# Patient Record
Sex: Female | Born: 1935 | Race: Black or African American | Hispanic: No | Smoking: Never smoker
Health system: Southern US, Community
[De-identification: ages and names within clinical notes are randomized; demographics above are authoritative.]

## PROBLEM LIST (undated history)

## (undated) DIAGNOSIS — E119 Type 2 diabetes mellitus without complications: Secondary | ICD-10-CM

## (undated) DIAGNOSIS — I4891 Unspecified atrial fibrillation: Secondary | ICD-10-CM

## (undated) DIAGNOSIS — S32000A Wedge compression fracture of unspecified lumbar vertebra, initial encounter for closed fracture: Secondary | ICD-10-CM

## (undated) DIAGNOSIS — N289 Disorder of kidney and ureter, unspecified: Secondary | ICD-10-CM

## (undated) DIAGNOSIS — I10 Essential (primary) hypertension: Secondary | ICD-10-CM

## (undated) DIAGNOSIS — I2510 Atherosclerotic heart disease of native coronary artery without angina pectoris: Secondary | ICD-10-CM

## (undated) SURGERY — LEFT HEART CATH AND CORONARY ANGIOGRAPHY
Anesthesia: Moderate Sedation | Laterality: Right

---

## 2004-07-19 ENCOUNTER — Other Ambulatory Visit: Payer: Self-pay

## 2005-08-05 ENCOUNTER — Ambulatory Visit: Payer: Self-pay | Admitting: Family Medicine

## 2005-09-05 ENCOUNTER — Ambulatory Visit: Payer: Self-pay | Admitting: Gastroenterology

## 2006-08-07 ENCOUNTER — Ambulatory Visit: Payer: Self-pay | Admitting: Family Medicine

## 2007-09-04 DIAGNOSIS — E785 Hyperlipidemia, unspecified: Secondary | ICD-10-CM | POA: Insufficient documentation

## 2007-09-15 ENCOUNTER — Ambulatory Visit: Payer: Self-pay | Admitting: Family Medicine

## 2007-12-30 ENCOUNTER — Ambulatory Visit: Payer: Self-pay | Admitting: Ophthalmology

## 2007-12-30 ENCOUNTER — Other Ambulatory Visit: Payer: Self-pay

## 2008-01-04 DIAGNOSIS — E669 Obesity, unspecified: Secondary | ICD-10-CM | POA: Insufficient documentation

## 2008-08-25 ENCOUNTER — Ambulatory Visit: Payer: Self-pay | Admitting: Family Medicine

## 2008-09-11 ENCOUNTER — Encounter: Admission: RE | Admit: 2008-09-11 | Discharge: 2008-09-11 | Payer: Self-pay | Admitting: Neurosurgery

## 2008-09-19 ENCOUNTER — Ambulatory Visit: Payer: Self-pay | Admitting: Family Medicine

## 2008-09-20 ENCOUNTER — Ambulatory Visit (HOSPITAL_COMMUNITY): Admission: RE | Admit: 2008-09-20 | Discharge: 2008-09-20 | Payer: Self-pay | Admitting: Neurosurgery

## 2008-10-10 ENCOUNTER — Inpatient Hospital Stay (HOSPITAL_COMMUNITY): Admission: RE | Admit: 2008-10-10 | Discharge: 2008-10-11 | Payer: Self-pay | Admitting: Neurosurgery

## 2009-09-08 ENCOUNTER — Ambulatory Visit: Payer: Self-pay | Admitting: Unknown Physician Specialty

## 2010-06-19 ENCOUNTER — Ambulatory Visit: Payer: Self-pay | Admitting: Family Medicine

## 2010-07-11 ENCOUNTER — Ambulatory Visit: Payer: Self-pay | Admitting: Ophthalmology

## 2010-09-30 ENCOUNTER — Ambulatory Visit: Payer: Self-pay | Admitting: Family Medicine

## 2011-03-26 NOTE — Op Note (Signed)
NAME:  Elizabeth Ford, Elizabeth Ford               ACCOUNT NO.:  [id number]   MEDICAL RECORD NO.:  [id number]          PATIENT TYPE:  INP   LOCATION:  3536                         FACILITY:  [facility]   PHYSICIAN:  Hewitt Shorts, M.D.DATE OF BIRTH:  Nov 14, 1925   DATE OF PROCEDURE:  10/10/2008  DATE OF DISCHARGE:                               OPERATIVE REPORT   PREOPERATIVE DIAGNOSES:  1. Right L2-3 lumbar disk herniation.  2. Lumbar degenerative disk disease.  3. Lumbar spondylosis.  4. Lumbar radiculopathy.   POSTOPERATIVE DIAGNOSIS:  1. Right L2-3 lumbar disk herniation.  2. Lumbar degenerative disk disease.  3. Lumbar spondylosis.  4. Lumbar radiculopathy.   PROCEDURES:  Right L2-3 lumbar laminotomy and microdiskectomy with  microdissection.   SURGEON:  Hewitt Shorts, MD   ASSISTANT:  1. Nelia Shi. Webb Silversmith, RN  2. Danae Orleans. Venetia Maxon, MD   ANESTHESIA:  General endotracheal.   INDICATIONS:  The patient is an 77 year old woman who presented with a  right lumbar radiculopathy.  MRI and postmyelogram CT scan revealed a  right L2-3 disk herniation with a fragment that had migrated caudally  behind the body of L3.  Decision was made to proceed with elective  laminotomy and microdiskectomy.   PROCEDURE:  The patient was brought to the operating room and placed  under general endotracheal anesthesia.  The patient was turned to a  prone position.  Lumbar region was prepped with Betadine soap and  solution and draped in a sterile fashion.  The midline was infiltrated  with local anesthetic with epinephrine and x-ray was taken and the L2-3  level identified.  A midline incision was made, carried down through the  subcutaneous tissue.  Bipolar cautery and electrocautery was used to  maintain hemostasis.  Dissection was carried down through lumbar fascia,  which was incised on the right side of the midline and the paraspinal  muscles were dissected from the spinous process and lamina in a  subperiosteal fashion.  The L2-3 intralaminar space was identified.  An  x-ray was taken to confirm the localization.  Then, the microscope was  draped and brought to the field to provide additional navigation,  illumination, and visualization and the remainder of the decompression  was performed using microdissection and microsurgical technique.   Laminotomy was performed using the X-Max drill and Kerrison punches.  The ligamentum flavum was thickened and this was carefully removed.  We  identified the thecal sac and exiting right L3 nerve root and gently  retracted the thecal sac and nerve root medially.  We found a free  fragment of disk compressing the exiting L3 nerve root.  This was  carefully removed in a piecemeal fashion.  The anulus of the L2-3 disk  was identified and the point of disk herniation was identified.  However, the disk space was not entered.  We did remove all these  fragments with disk material from the epidural space and achieved good  decompression.  The wound was irrigated with bacitracin solution and  checked for hemostasis, which was confirmed and then we proceeded with  closure.  Prior to  closure, we instilled 2 mL of fentanyl into the  epidural space.  Deep fascia was closed with interrupted undyed 1 Vicryl  sutures.  Scarpa fascia was closed with interrupted undyed 1 Vicryl  sutures.  The subcutaneous and subcuticular were closed with interrupted  inverted 2-0 undyed Vicryl sutures and the skin was approximated with  Dermabond.  The procedure was tolerated well.  The estimated blood loss  was 50 mL.  Sponge and needle count were correct.  Following surgery,  the patient was returned back to a supine position, reversed from the  anesthetic, extubated, and transferred to the recovery room for further  care where she was noted to be moving all 4 extremities to command.      Hewitt Shorts, M.D.  Electronically Signed     RWN/MEDQ  D:  10/10/2008   T:  10/11/2008  Job:  [id number]   cc:   Hewitt Shorts, M.D.

## 2011-08-13 LAB — CBC
HCT: 34.5 % — ABNORMAL LOW (ref 36.0–46.0)
Hemoglobin: 11.6 g/dL — ABNORMAL LOW (ref 12.0–15.0)
MCHC: 33.8 g/dL (ref 30.0–36.0)
MCV: 87.5 fL (ref 78.0–100.0)
Platelets: 172 10*3/uL (ref 150–400)
RBC: 3.94 MIL/uL (ref 3.87–5.11)
RDW: 13.1 % (ref 11.5–15.5)
WBC: 7 10*3/uL (ref 4.0–10.5)

## 2011-08-13 LAB — GLUCOSE, CAPILLARY
Glucose-Capillary: 111 mg/dL — ABNORMAL HIGH (ref 70–99)
Glucose-Capillary: 132 mg/dL — ABNORMAL HIGH (ref 70–99)
Glucose-Capillary: 195 mg/dL — ABNORMAL HIGH (ref 70–99)
Glucose-Capillary: 85 mg/dL (ref 70–99)
Glucose-Capillary: 126 — ABNORMAL HIGH
Glucose-Capillary: 149 — ABNORMAL HIGH

## 2011-08-13 LAB — BASIC METABOLIC PANEL
BUN: 33 mg/dL — ABNORMAL HIGH (ref 6–23)
CO2: 26 mEq/L (ref 19–32)
Calcium: 9.6 mg/dL (ref 8.4–10.5)
Chloride: 109 mEq/L (ref 96–112)
Creatinine, Ser: 2.23 mg/dL — ABNORMAL HIGH (ref 0.4–1.2)
GFR calc Af Amer: 26 mL/min — ABNORMAL LOW (ref >60)
GFR calc non Af Amer: 21 mL/min — ABNORMAL LOW (ref >60)
Glucose, Bld: 149 mg/dL — ABNORMAL HIGH (ref 70–99)
Potassium: 3.8 mEq/L (ref 3.5–5.1)
Sodium: 142 mEq/L (ref 135–145)

## 2011-08-16 LAB — GLUCOSE, CAPILLARY: Glucose-Capillary: 173 mg/dL — ABNORMAL HIGH (ref 70–99)

## 2012-12-21 ENCOUNTER — Ambulatory Visit: Payer: Self-pay

## 2013-03-02 DIAGNOSIS — R5383 Other fatigue: Secondary | ICD-10-CM | POA: Insufficient documentation

## 2013-03-24 DIAGNOSIS — M1990 Unspecified osteoarthritis, unspecified site: Secondary | ICD-10-CM | POA: Insufficient documentation

## 2013-03-24 DIAGNOSIS — E790 Hyperuricemia without signs of inflammatory arthritis and tophaceous disease: Secondary | ICD-10-CM | POA: Insufficient documentation

## 2013-08-26 DIAGNOSIS — G3184 Mild cognitive impairment, so stated: Secondary | ICD-10-CM | POA: Insufficient documentation

## 2013-10-06 DIAGNOSIS — R000 Tachycardia, unspecified: Secondary | ICD-10-CM | POA: Insufficient documentation

## 2013-10-06 DIAGNOSIS — R5381 Other malaise: Secondary | ICD-10-CM | POA: Insufficient documentation

## 2013-10-14 DIAGNOSIS — R809 Proteinuria, unspecified: Secondary | ICD-10-CM | POA: Insufficient documentation

## 2013-10-28 DIAGNOSIS — Z7189 Other specified counseling: Secondary | ICD-10-CM | POA: Insufficient documentation

## 2014-01-13 DIAGNOSIS — L84 Corns and callosities: Secondary | ICD-10-CM | POA: Insufficient documentation

## 2014-01-24 DIAGNOSIS — N390 Urinary tract infection, site not specified: Secondary | ICD-10-CM | POA: Insufficient documentation

## 2015-06-26 ENCOUNTER — Encounter: Payer: Self-pay | Admitting: Emergency Medicine

## 2015-06-26 ENCOUNTER — Other Ambulatory Visit: Payer: Self-pay

## 2015-06-26 ENCOUNTER — Ambulatory Visit: Payer: Medicare Other

## 2015-06-26 ENCOUNTER — Ambulatory Visit
Admission: EM | Admit: 2015-06-26 | Discharge: 2015-06-26 | Disposition: A | Discharge status: Home / Self Care | Payer: Medicare Other | Attending: Family Medicine | Admitting: Family Medicine

## 2015-06-26 DIAGNOSIS — I4891 Unspecified atrial fibrillation: Secondary | ICD-10-CM | POA: Diagnosis not present

## 2015-06-26 DIAGNOSIS — I10 Essential (primary) hypertension: Secondary | ICD-10-CM | POA: Diagnosis not present

## 2015-06-26 DIAGNOSIS — M25511 Pain in right shoulder: Secondary | ICD-10-CM | POA: Diagnosis not present

## 2015-06-26 HISTORY — DX: Essential (primary) hypertension: I10

## 2015-06-26 NOTE — ED Notes (Signed)
Pt discharge instructions discussed by provider Laurey Morale PA

## 2015-06-26 NOTE — ED Notes (Signed)
Pt states that today at 12pm she was going down the stairs and she states she got really dizzy and fell down the stairs

## 2015-06-26 NOTE — ED Provider Notes (Signed)
CSN: [id number]     Arrival date & time 06/26/15  1601 History   First MD Initiated Contact with Patient 06/26/15 1705     Chief Complaint  Patient presents with  . Fall   (Consider location/radiation/quality/duration/timing/severity/associated sxs/prior Treatment) HPI 81 yo F walking in front of house today, became intensely dizzy and fell to the ground striking her right arm and causing discomfort right shoulder. Mild abrasion point of right elbow. Traveled here with granddaughter-denies any head contact-no tenderness of wrists,  knees or ankles.  Has had a dizzy spell on occasion in the past. None recently but remembers once almost falling out of the bed with one. Has dislocated her right shoulder before She has known hypertension. Her sister has Afib Bradly Bienenstock.  She takes metroprolol and ASA 325 mg and a "kidney drug" she doesn't know name of. Sees Dr Lennox Grumbles at [facility]  Past Medical History  Diagnosis Date  . Hypertension    History reviewed. No pertinent past surgical history. Family History  Problem Relation Age of Onset  . Atrial fibrillation Sister    Social History  Substance Use Topics  . Smoking status: Never Smoker   . Smokeless tobacco: None  . Alcohol Use: No   OB History    No data available     Review of Systems Constitutional: No fever.  Eyes: No visual changes. ENT:No sore throat. Cardiovascular:Negative for chest pain/palpitations Respiratory: Negative for shortness of breath Gastrointestinal: No abdominal pain. No nausea,vomiting, diarrhea Genitourinary: Negative for dysuria. Normal urination. Musculoskeletal: Negative for back pain. FROM extremities without pain- except right shoulder is uncomfortable Skin: Negative for rash Neurological: Negative for headache, focal weakness or numbness  Allergies  Review of patient's allergies indicates no known allergies.  Home Medications   Prior to Admission medications   Not on File   BP 168/87 mmHg   Pulse 63  Temp(Src) 98.2 F (36.8 C) (Oral)  Resp 16  SpO2 100% Physical Exam   Constitutional -alert and oriented,well appearing and in mild distress shoulder, relaxed and interactive  Head-atraumatic, normocephalic Eyes- conjunctiva normal, EOMI ,conjugate gaze Nose- no congestion or rhinorrhea Mouth/throat- mucous membranes moist , Neck- supple CV- irregularly irregular rate , grossly normal heart sounds,  Resp-no distress, normal respiratory effort,clear to auscultation bilaterally- no SOB,  GI- no complaints GU-deferred MSK- non tender, normal ROM, most extremities, ambulatory with cane suuport, self-care-lives alone with husband who is ill Right UE with good pulses, warm , good grip - wrist and elbow negative- right shoulder without ecchymosis, abrasion, Cannot abduct above70 degress; crossover slightly limited ; Left shoulder full crossover and can abduct to 90.  Good grips bilaterally, no neurosensory deficit noted,  Neuro- normal speech and language, no gross focal neurological deficit appreciated, no gait instability, Skin-warm,dry ,intact; no rash noted Psych-mood and affect grossly normal; speech and behavior grossly normal ED Course  Procedures (including critical care time) Labs Review Labs Reviewed - No data to display  Imaging Review Dg Shoulder Right  06/26/2015   CLINICAL DATA:  Patient fell today on cement on her right shoulder. She is having right shoulder pain. She has dislocated her right shoulder a few years ago  EXAM: RIGHT SHOULDER - 2+ VIEW  COMPARISON:  None.  FINDINGS: No acute fracture.  No dislocation.  Mild to moderate AC joint osteoarthritis. There are calcifications along the superior margin of the [facility] joint which are likely synovial/ capsular.  Glenohumeral joint is normally spaced and aligned. There are minimal marginal osteophytes  from the base of the humeral head. There is a rim of calcification along the margin of the glenoid consistent with  calcification along the labrum.  There are other calcifications that project superior to the humeral head and below the acromion along the expected course of the rotator cuff tendons. There is a small subacromial spur.  Bones are demineralized.  Soft tissues are otherwise unremarkable.  IMPRESSION: 1. No fracture or dislocation.  No acute finding. 2. There calcifications involving the capsule/synovium of the [facility] joint, glenoid labrum and rotator cuff. There also mild to moderate AC joint osteoarthritic changes and a subacromial spur.   Electronically Signed   By: Lajean Manes M.D.   On: 06/26/2015 17:09   EKG with A fib   75 rate non-specific ST  T wave changes MDM   Discussed Xray - negative for fracture or dislocation but positive for arthritis. Has Ephraim Hamburger and encouraged to use it and ice packs, sling prn Discussed EKG and now she remembers being in the hospital about a year ago for an episode of symptomatic Afib- She is clinically stable on presentation this evening but needs re-evaluation with long term care provider in AM. Granddaughter included in conversation and agrees. Encourage them to put a meds list in their phones and in her wallet   1. Right shoulder pain   2. Atrial fibrillation, unspecified    Plan: 1. Test results and diagnosis reviewed with patient and grandaughter 2. Maintain usual  Medications- takes 325 mg ASA daily 3. Recommend supportive treatment with right arm sling  applied, ice pak 4. Proceed to ER of choice if any symptoms reviewed develop during the night.  Go to [facility] in the morning to see Dr Lennox Grumbles. Take EKG copy.    Jan Fireman, PA-C 06/26/15 1859

## 2015-06-26 NOTE — Discharge Instructions (Signed)
Please see Dr Lennox Grumbles tomorrow -take your EKG with you ! Take all medications that you take with you for her to see.    IF YOU HAVE CHEST PAIN, DIZZINESS, FALLING, SHORT OF BREATH OR OTHER SYMPTOMS OF CONCERN PLEASE GO TO  THE EMERGENCY ROOM   Atrial Fibrillation Atrial fibrillation is a type of irregular heart rhythm (arrhythmia). During atrial fibrillation, the upper chambers of the heart (atria) quiver continuously in a chaotic pattern. This causes an irregular and often rapid heart rate.  Atrial fibrillation is the result of the heart becoming overloaded with disorganized signals that tell it to beat. These signals are normally released one at a time by a part of the right atrium called the sinoatrial node. They then travel from the atria to the lower chambers of the heart (ventricles), causing the atria and ventricles to contract and pump blood as they pass. In atrial fibrillation, parts of the atria outside of the sinoatrial node also release these signals. This results in two problems. First, the atria receive so many signals that they do not have time to fully contract. Second, the ventricles, which can only receive one signal at a time, beat irregularly and out of rhythm with the atria.  There are three types of atrial fibrillation:   Paroxysmal. Paroxysmal atrial fibrillation starts suddenly and stops on its own within a week.  Persistent. Persistent atrial fibrillation lasts for more than a week. It may stop on its own or with treatment.  Permanent. Permanent atrial fibrillation does not go away. Episodes of atrial fibrillation may lead to permanent atrial fibrillation. Atrial fibrillation can prevent your heart from pumping blood normally. It increases your risk of stroke and can lead to heart failure.  CAUSES   Heart conditions, including a heart attack, heart failure, coronary artery disease, and heart valve conditions.   Inflammation of the sac that surrounds the heart  (pericarditis).  Blockage of an artery in the lungs (pulmonary embolism).  Pneumonia or other infections.  Chronic lung disease.  Thyroid problems, especially if the thyroid is overactive (hyperthyroidism).  Caffeine, excessive alcohol use, and use of some illegal drugs.   Use of some medicines, including certain decongestants and diet pills.  Heart surgery.   Birth defects.  Sometimes, no cause can be found. When this happens, the atrial fibrillation is called lone atrial fibrillation. The risk of complications from atrial fibrillation increases if you have lone atrial fibrillation and you are age 10 years or older. RISK FACTORS  Heart failure.  Coronary artery disease.  Diabetes mellitus.   High blood pressure (hypertension).   Obesity.   Other arrhythmias.   Increased age. SIGNS AND SYMPTOMS   A feeling that your heart is beating rapidly or irregularly.   A feeling of discomfort or pain in your chest.   Shortness of breath.   Sudden light-headedness or weakness.   Getting tired easily when exercising.   Urinating more often than normal (mainly when atrial fibrillation first begins).  In paroxysmal atrial fibrillation, symptoms may start and suddenly stop. DIAGNOSIS  Your health care provider may be able to detect atrial fibrillation when taking your pulse. Your health care provider may have you take a test called an ambulatory electrocardiogram (ECG). An ECG records your heartbeat patterns over a 24-hour period. You may also have other tests, such as:  Transthoracic echocardiogram (TTE). During echocardiography, sound waves are used to evaluate how blood flows through your heart.  Transesophageal echocardiogram (TEE).  Stress test. There is  more than one type of stress test. If a stress test is needed, ask your health care provider about which type is best for you.  Chest X-ray exam.  Blood tests.  Computed tomography (CT). TREATMENT    Treatment may include:  Treating any underlying conditions. For example, if you have an overactive thyroid, treating the condition may correct atrial fibrillation.  Taking medicine. Medicines may be given to control a rapid heart rate or to prevent blood clots, heart failure, or a stroke.  Having a procedure to correct the rhythm of the heart:  Electrical cardioversion. During electrical cardioversion, a controlled, low-energy shock is delivered to the heart through your skin. If you have chest pain, very low blood pressure, or sudden heart failure, this procedure may need to be done as an emergency.  Catheter ablation. During this procedure, heart tissues that send the signals that cause atrial fibrillation are destroyed.  Surgical ablation. During this surgery, thin lines of heart tissue that carry the abnormal signals are destroyed. This procedure can either be an open-heart surgery or a minimally invasive surgery. With the minimally invasive surgery, small cuts are made to access the heart instead of a large opening.  Pulmonary venous isolation. During this surgery, tissue around the veins that carry blood from the lungs (pulmonary veins) is destroyed. This tissue is thought to carry the abnormal signals. HOME CARE INSTRUCTIONS   Take medicines only as directed by your health care provider. Some medicines can make atrial fibrillation worse or recur.  If blood thinners were prescribed by your health care provider, take them exactly as directed. Too much blood-thinning medicine can cause bleeding. If you take too little, you will not have the needed protection against stroke and other problems.  Perform blood tests at home if directed by your health care provider. Perform blood tests exactly as directed.  Quit smoking if you smoke.  Do not drink alcohol.  Do not drink caffeinated beverages such as coffee, soda, and some teas. You may drink decaffeinated coffee, soda, or tea.    Maintain a healthy weight.Do not use diet pills unless your health care provider approves. They may make heart problems worse.   Follow diet instructions as directed by your health care provider.  Exercise regularly as directed by your health care provider.  Keep all follow-up visits as directed by your health care provider. This is important. PREVENTION  The following substances can cause atrial fibrillation to recur:   Caffeinated beverages.  Alcohol.  Certain medicines, especially those used for breathing problems.  Certain herbs and herbal medicines, such as those containing ephedra or ginseng.  Illegal drugs, such as cocaine and amphetamines. Sometimes medicines are given to prevent atrial fibrillation from recurring. Proper treatment of any underlying condition is also important in helping prevent recurrence.  SEEK MEDICAL CARE IF:  You notice a change in the rate, rhythm, or strength of your heartbeat.  You suddenly begin urinating more frequently.  You tire more easily when exerting yourself or exercising. SEEK IMMEDIATE MEDICAL CARE IF:   You have chest pain, abdominal pain, sweating, or weakness.  You feel nauseous.  You have shortness of breath.  You suddenly have swollen feet and ankles.  You feel dizzy.  Your face or limbs feel numb or weak.  You have a change in your vision or speech. MAKE SURE YOU:   Understand these instructions.  Will watch your condition.  Will get help right away if you are not doing well or get  worse. Document Released: 10/28/2005 Document Revised: 03/14/2014 Document Reviewed: 12/08/2012 [facility] Patient Information 2015 [place], [place]. This information is not intended to replace advice given to you by your health care provider. Make sure you discuss any questions you have with your health care provider.  Arm Sling Use A sling is used to:  Limit how much your arm moves.  Make you more comfortable.  Support your  arm. The sling fits well if:  Your elbow rests in the bottom and corner pocket.  Only your fingers show at the opening. Your wrist should fit inside and be supported by the sling.  The strap goes around your shoulder or neck for support.  Your arm is fairly level with your hand, slightly higher than your elbow. HOME CARE   Adjust the sling to keep the hand inside. Slings tend to slip, making the elbow point up. Tug the elbow back into place.  The fingers should feel warm and be a normal color.  Try to keep the palm of the hand toward the body while wearing the sling.  Take the sling off when going to sleep if this is okay with your doctor.  Use an extra pillow at night to protect the arm. Slide the arm between a pillow and the cover.  Take baths or showers as told by your doctor.  Only take medicine as told by your doctor. GET HELP RIGHT AWAY IF:   The fingers turn cold or start to tingle.  The arm pain gets worse.  The pain is not helped by medicine or by adjusting the sling. MAKE SURE YOU:   Understand these instructions.  Will watch this condition.  Will get help right away if you are not doing well or get worse. Document Released: 04/15/2008 Document Revised: 01/20/2012 Document Reviewed: 04/15/2008 [facility] Patient Information 2015 [place], [place]. This information is not intended to replace advice given to you by your health care provider. Make sure you discuss any questions you have with your health care provider.

## 2015-06-27 DIAGNOSIS — R42 Dizziness and giddiness: Secondary | ICD-10-CM | POA: Insufficient documentation

## 2015-07-28 ENCOUNTER — Observation Stay
Admission: EM | Admit: 2015-07-28 | Discharge: 2015-07-30 | Payer: No Typology Code available for payment source | Attending: Internal Medicine | Admitting: Internal Medicine

## 2015-07-28 ENCOUNTER — Encounter: Payer: Self-pay | Admitting: Emergency Medicine

## 2015-07-28 ENCOUNTER — Other Ambulatory Visit: Payer: Self-pay

## 2015-07-28 ENCOUNTER — Emergency Department: Payer: No Typology Code available for payment source

## 2015-07-28 DIAGNOSIS — R109 Unspecified abdominal pain: Secondary | ICD-10-CM | POA: Diagnosis not present

## 2015-07-28 DIAGNOSIS — M25559 Pain in unspecified hip: Secondary | ICD-10-CM | POA: Insufficient documentation

## 2015-07-28 DIAGNOSIS — S161XXA Strain of muscle, fascia and tendon at neck level, initial encounter: Secondary | ICD-10-CM | POA: Diagnosis not present

## 2015-07-28 DIAGNOSIS — I272 Other secondary pulmonary hypertension: Secondary | ICD-10-CM | POA: Insufficient documentation

## 2015-07-28 DIAGNOSIS — M4856XA Collapsed vertebra, not elsewhere classified, lumbar region, initial encounter for fracture: Secondary | ICD-10-CM | POA: Insufficient documentation

## 2015-07-28 DIAGNOSIS — J90 Pleural effusion, not elsewhere classified: Secondary | ICD-10-CM | POA: Insufficient documentation

## 2015-07-28 DIAGNOSIS — I471 Supraventricular tachycardia: Secondary | ICD-10-CM | POA: Insufficient documentation

## 2015-07-28 DIAGNOSIS — D3502 Benign neoplasm of left adrenal gland: Secondary | ICD-10-CM | POA: Diagnosis not present

## 2015-07-28 DIAGNOSIS — Z7982 Long term (current) use of aspirin: Secondary | ICD-10-CM | POA: Diagnosis not present

## 2015-07-28 DIAGNOSIS — R55 Syncope and collapse: Secondary | ICD-10-CM | POA: Diagnosis not present

## 2015-07-28 DIAGNOSIS — M4802 Spinal stenosis, cervical region: Secondary | ICD-10-CM | POA: Insufficient documentation

## 2015-07-28 DIAGNOSIS — Z79899 Other long term (current) drug therapy: Secondary | ICD-10-CM | POA: Diagnosis not present

## 2015-07-28 DIAGNOSIS — M4806 Spinal stenosis, lumbar region: Secondary | ICD-10-CM | POA: Insufficient documentation

## 2015-07-28 DIAGNOSIS — I10 Essential (primary) hypertension: Secondary | ICD-10-CM | POA: Diagnosis not present

## 2015-07-28 DIAGNOSIS — S301XXA Contusion of abdominal wall, initial encounter: Secondary | ICD-10-CM | POA: Insufficient documentation

## 2015-07-28 DIAGNOSIS — I2510 Atherosclerotic heart disease of native coronary artery without angina pectoris: Secondary | ICD-10-CM | POA: Diagnosis not present

## 2015-07-28 DIAGNOSIS — Z23 Encounter for immunization: Secondary | ICD-10-CM | POA: Diagnosis not present

## 2015-07-28 DIAGNOSIS — I482 Chronic atrial fibrillation: Secondary | ICD-10-CM | POA: Insufficient documentation

## 2015-07-28 DIAGNOSIS — S39012A Strain of muscle, fascia and tendon of lower back, initial encounter: Secondary | ICD-10-CM | POA: Diagnosis not present

## 2015-07-28 DIAGNOSIS — N390 Urinary tract infection, site not specified: Secondary | ICD-10-CM | POA: Insufficient documentation

## 2015-07-28 DIAGNOSIS — N1330 Unspecified hydronephrosis: Secondary | ICD-10-CM | POA: Insufficient documentation

## 2015-07-28 DIAGNOSIS — I6523 Occlusion and stenosis of bilateral carotid arteries: Secondary | ICD-10-CM | POA: Diagnosis not present

## 2015-07-28 DIAGNOSIS — K760 Fatty (change of) liver, not elsewhere classified: Secondary | ICD-10-CM | POA: Diagnosis not present

## 2015-07-28 DIAGNOSIS — S338XXA Sprain of other parts of lumbar spine and pelvis, initial encounter: Secondary | ICD-10-CM | POA: Diagnosis present

## 2015-07-28 DIAGNOSIS — E049 Nontoxic goiter, unspecified: Secondary | ICD-10-CM | POA: Diagnosis not present

## 2015-07-28 DIAGNOSIS — S20219A Contusion of unspecified front wall of thorax, initial encounter: Secondary | ICD-10-CM | POA: Diagnosis not present

## 2015-07-28 DIAGNOSIS — M11262 Other chondrocalcinosis, left knee: Secondary | ICD-10-CM | POA: Diagnosis not present

## 2015-07-28 DIAGNOSIS — T1490XA Injury, unspecified, initial encounter: Secondary | ICD-10-CM

## 2015-07-28 DIAGNOSIS — M545 Low back pain: Secondary | ICD-10-CM | POA: Insufficient documentation

## 2015-07-28 DIAGNOSIS — R748 Abnormal levels of other serum enzymes: Secondary | ICD-10-CM | POA: Diagnosis not present

## 2015-07-28 DIAGNOSIS — I639 Cerebral infarction, unspecified: Secondary | ICD-10-CM

## 2015-07-28 DIAGNOSIS — T1490 Injury, unspecified: Secondary | ICD-10-CM | POA: Diagnosis present

## 2015-07-28 DIAGNOSIS — Z87442 Personal history of urinary calculi: Secondary | ICD-10-CM | POA: Insufficient documentation

## 2015-07-28 DIAGNOSIS — E119 Type 2 diabetes mellitus without complications: Secondary | ICD-10-CM | POA: Insufficient documentation

## 2015-07-28 DIAGNOSIS — K8020 Calculus of gallbladder without cholecystitis without obstruction: Secondary | ICD-10-CM | POA: Insufficient documentation

## 2015-07-28 DIAGNOSIS — Z91013 Allergy to seafood: Secondary | ICD-10-CM | POA: Insufficient documentation

## 2015-07-28 DIAGNOSIS — R079 Chest pain, unspecified: Secondary | ICD-10-CM | POA: Insufficient documentation

## 2015-07-28 DIAGNOSIS — S20211A Contusion of right front wall of thorax, initial encounter: Secondary | ICD-10-CM

## 2015-07-28 HISTORY — DX: Disorder of kidney and ureter, unspecified: N28.9

## 2015-07-28 HISTORY — DX: Type 2 diabetes mellitus without complications: E11.9

## 2015-07-28 HISTORY — DX: Unspecified atrial fibrillation: I48.91

## 2015-07-28 LAB — MAGNESIUM: MAGNESIUM: 1.6 mg/dL — AB (ref 1.7–2.4)

## 2015-07-28 LAB — BASIC METABOLIC PANEL
Anion gap: 9 (ref 5–15)
BUN: 29 mg/dL — AB (ref 6–20)
CALCIUM: 9.2 mg/dL (ref 8.9–10.3)
CO2: 22 mmol/L (ref 22–32)
CREATININE: 1.53 mg/dL — AB (ref 0.44–1.00)
Chloride: 109 mmol/L (ref 101–111)
GFR, EST AFRICAN AMERICAN: 34 mL/min — AB (ref >60)
GFR, EST NON AFRICAN AMERICAN: 30 mL/min — AB (ref >60)
Glucose, Bld: 111 mg/dL — ABNORMAL HIGH (ref 65–99)
Potassium: 3.4 mmol/L — ABNORMAL LOW (ref 3.5–5.1)
SODIUM: 140 mmol/L (ref 135–145)

## 2015-07-28 LAB — CBC
HCT: 35.8 % (ref 35.0–47.0)
HEMOGLOBIN: 12 g/dL (ref 12.0–16.0)
MCH: 29.3 pg (ref 26.0–34.0)
MCHC: 33.6 g/dL (ref 32.0–36.0)
MCV: 87.1 fL (ref 80.0–100.0)
PLATELETS: 155 10*3/uL (ref 150–440)
RBC: 4.11 MIL/uL (ref 3.80–5.20)
RDW: 14.1 % (ref 11.5–14.5)
WBC: 11 10*3/uL (ref 3.6–11.0)

## 2015-07-28 LAB — APTT: aPTT: 30 seconds (ref 24–36)

## 2015-07-28 LAB — TROPONIN I: Troponin I: 0.03 ng/mL (ref <0.031)

## 2015-07-28 LAB — TYPE AND SCREEN
ABO/RH(D): O POS
Antibody Screen: NEGATIVE

## 2015-07-28 LAB — ABO/RH: ABO/RH(D): O POS

## 2015-07-28 LAB — PROTIME-INR
INR: 1.09
PROTHROMBIN TIME: 14.3 s (ref 11.4–15.0)

## 2015-07-28 MED ORDER — INFLUENZA VAC SPLIT QUAD 0.5 ML IM SUSY
0.5000 mL | PREFILLED_SYRINGE | INTRAMUSCULAR | Status: AC
  Administered 2015-07-29: 0.5 mL via INTRAMUSCULAR
  Filled 2015-07-28: qty 0.5

## 2015-07-28 MED ORDER — ONDANSETRON HCL 4 MG PO TABS: 4.0000 mg | ORAL_TABLET | Freq: Four times a day (QID) | ORAL | Status: DC | PRN

## 2015-07-28 MED ORDER — IRBESARTAN 75 MG PO TABS
75.0000 mg | ORAL_TABLET | Freq: Every evening | ORAL | Status: DC
  Filled 2015-07-28: qty 1

## 2015-07-28 MED ORDER — ONDANSETRON HCL 4 MG/2ML IJ SOLN
4.0000 mg | Freq: Four times a day (QID) | INTRAMUSCULAR | Status: DC | PRN
  Administered 2015-07-29 – 2015-07-30 (×2): 4 mg via INTRAVENOUS
  Filled 2015-07-28 (×2): qty 2

## 2015-07-28 MED ORDER — HYDROMORPHONE HCL 1 MG/ML IJ SOLN
INTRAMUSCULAR | Status: AC
  Administered 2015-07-28: 0.5 mg via INTRAVENOUS
  Filled 2015-07-28: qty 1

## 2015-07-28 MED ORDER — ACETAMINOPHEN 650 MG RE SUPP: 650.0000 mg | Freq: Four times a day (QID) | RECTAL | Status: DC | PRN

## 2015-07-28 MED ORDER — SODIUM CHLORIDE 0.9 % IV BOLUS (SEPSIS)
500.0000 mL | Freq: Once | INTRAVENOUS | Status: AC
  Administered 2015-07-28: 500 mL via INTRAVENOUS

## 2015-07-28 MED ORDER — ACETAMINOPHEN 325 MG PO TABS
650.0000 mg | ORAL_TABLET | Freq: Four times a day (QID) | ORAL | Status: DC | PRN
  Administered 2015-07-28 – 2015-07-30 (×3): 650 mg via ORAL
  Filled 2015-07-28 (×3): qty 2

## 2015-07-28 MED ORDER — MORPHINE SULFATE (PF) 2 MG/ML IV SOLN: 1.0000 mg | INTRAVENOUS | Status: DC | PRN

## 2015-07-28 MED ORDER — HYDROMORPHONE HCL 1 MG/ML IJ SOLN
0.5000 mg | Freq: Once | INTRAMUSCULAR | Status: AC
  Administered 2015-07-28: 0.5 mg via INTRAVENOUS

## 2015-07-28 MED ORDER — FENTANYL CITRATE (PF) 100 MCG/2ML IJ SOLN
50.0000 ug | Freq: Once | INTRAMUSCULAR | Status: AC
  Administered 2015-07-28: 50 ug via INTRAVENOUS
  Filled 2015-07-28: qty 2

## 2015-07-28 MED ORDER — ASPIRIN EC 325 MG PO TBEC
325.0000 mg | DELAYED_RELEASE_TABLET | Freq: Every evening | ORAL | Status: DC
  Administered 2015-07-29: 325 mg via ORAL
  Filled 2015-07-28 (×2): qty 1

## 2015-07-28 MED ORDER — SODIUM CHLORIDE 0.9 % IJ SOLN
3.0000 mL | Freq: Two times a day (BID) | INTRAMUSCULAR | Status: DC
Start: 2015-07-28 — End: 2015-07-30
  Administered 2015-07-28 – 2015-07-30 (×3): 3 mL via INTRAVENOUS

## 2015-07-28 MED ORDER — HEPARIN SODIUM (PORCINE) 5000 UNIT/ML IJ SOLN
5000.0000 [IU] | Freq: Three times a day (TID) | INTRAMUSCULAR | Status: DC
  Administered 2015-07-28: 5000 [IU] via SUBCUTANEOUS
  Filled 2015-07-28: qty 1

## 2015-07-28 MED ORDER — METOPROLOL SUCCINATE ER 25 MG PO TB24
25.0000 mg | ORAL_TABLET | Freq: Every day | ORAL | Status: DC
  Administered 2015-07-29: 25 mg via ORAL
  Filled 2015-07-28: qty 1

## 2015-07-28 MED ORDER — IOHEXOL 300 MG/ML  SOLN
100.0000 mL | Freq: Once | INTRAMUSCULAR | Status: AC | PRN
  Administered 2015-07-28: 100 mL via INTRAVENOUS
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
## 2015-07-28 NOTE — ED Provider Notes (Addendum)
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 initial encounter      NEW MEDICATIONS STARTED DURING THIS VISIT:  New Prescriptions   No medications on file     Eula Listen, MD 07/28/15 1924  Eula Listen, MD 07/28/15 1924  Eula Listen, MD 07/28/15 1944
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
## 2015-07-29 ENCOUNTER — Observation Stay
Admit: 2015-07-29 | Discharge: 2015-07-29 | Disposition: A | Discharge status: Home / Self Care | Payer: No Typology Code available for payment source | Attending: Specialist | Admitting: Specialist

## 2015-07-29 ENCOUNTER — Observation Stay: Payer: No Typology Code available for payment source

## 2015-07-29 DIAGNOSIS — R55 Syncope and collapse: Secondary | ICD-10-CM | POA: Diagnosis not present

## 2015-07-29 LAB — URINALYSIS COMPLETE WITH MICROSCOPIC (ARMC ONLY)
Bilirubin Urine: NEGATIVE
Glucose, UA: NEGATIVE mg/dL
Ketones, ur: NEGATIVE mg/dL
Nitrite: NEGATIVE
PROTEIN: 30 mg/dL — AB
SPECIFIC GRAVITY, URINE: 1.035 — AB (ref 1.005–1.030)
pH: 5 (ref 5.0–8.0)

## 2015-07-29 LAB — TROPONIN I
TROPONIN I: 0.04 ng/mL — AB (ref <0.031)
Troponin I: 0.05 ng/mL — ABNORMAL HIGH (ref <0.031)

## 2015-07-29 LAB — HEPARIN LEVEL (UNFRACTIONATED)
HEPARIN UNFRACTIONATED: 0.89 [IU]/mL — AB (ref 0.30–0.70)
Heparin Unfractionated: 0.67 IU/mL (ref 0.30–0.70)

## 2015-07-29 MED ORDER — HEPARIN (PORCINE) IN NACL 100-0.45 UNIT/ML-% IJ SOLN
750.0000 [IU]/h | INTRAMUSCULAR | Status: DC
  Administered 2015-07-29: 900 [IU]/h via INTRAVENOUS
  Administered 2015-07-30: 750 [IU]/h via INTRAVENOUS
  Filled 2015-07-29 (×3): qty 250

## 2015-07-29 MED ORDER — TRAMADOL HCL 50 MG PO TABS
50.0000 mg | ORAL_TABLET | Freq: Four times a day (QID) | ORAL | Status: DC | PRN
  Administered 2015-07-29: 50 mg via ORAL
  Filled 2015-07-29: qty 1

## 2015-07-29 MED ORDER — KETOROLAC TROMETHAMINE 15 MG/ML IJ SOLN: 15.0000 mg | Freq: Four times a day (QID) | INTRAMUSCULAR | Status: DC | PRN

## 2015-07-29 MED ORDER — METOPROLOL TARTRATE 25 MG PO TABS
25.0000 mg | ORAL_TABLET | Freq: Two times a day (BID) | ORAL | Status: DC
  Administered 2015-07-29 – 2015-07-30 (×2): 25 mg via ORAL
  Filled 2015-07-29 (×3): qty 1

## 2015-07-29 MED ORDER — DEXTROSE 5 % IV SOLN
1.0000 g | INTRAVENOUS | Status: DC
  Administered 2015-07-29 – 2015-07-30 (×2): 1 g via INTRAVENOUS
  Filled 2015-07-29 (×3): qty 10

## 2015-07-29 NOTE — Progress Notes (Signed)
Patient alert and oriented x4, no complaints at this time. vss at this time. Patient NSR on telemetry. Will continue to assess. Patient up in chair at this time eating lunch. Wilnette Kales
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
## 2015-07-29 NOTE — Progress Notes (Signed)
Patient was educated on the importance of increasing activity to avoid continued weakness and atrophy.  Patient stated understanding of instructions/information provided.
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
## 2015-07-29 NOTE — Progress Notes (Signed)
*  PRELIMINARY RESULTS* Echocardiogram 2D Echocardiogram has been performed.  Elizabeth Ford 07/29/2015, 11:11 AM
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
## 2015-07-29 NOTE — Progress Notes (Signed)
Patient had 3 beat run of SVT. Dr. Humphrey Rolls notified and metoprolol dosage adjusted. Patient has no complaints at this time, will cont to assess. Wilnette Kales

## 2015-07-29 NOTE — Progress Notes (Signed)
U/A results reported to Dr. Reece Levy with no new order.  Dr. Reece Levy stated he was going to put an order in for blood culture to be done.

## 2015-07-29 NOTE — Progress Notes (Signed)
Patient bp low, nauseous, and clammy. Dr. Benjie Karvonen notified and ordered for BP meds to be held at this time, nausea may be due to pain medication given earlier. No other orders at this time. Zofran given will reassess. Wilnette Kales

## 2015-07-30 ENCOUNTER — Ambulatory Visit (HOSPITAL_COMMUNITY)
Admission: AD | Admit: 2015-07-30 | Discharge: 2015-07-30 | Disposition: A | Discharge status: Home / Self Care | Payer: Medicare Other | Source: Other Acute Inpatient Hospital | Attending: Internal Medicine | Admitting: Internal Medicine

## 2015-07-30 DIAGNOSIS — R55 Syncope and collapse: Secondary | ICD-10-CM | POA: Diagnosis present

## 2015-07-30 DIAGNOSIS — T1490XA Injury, unspecified, initial encounter: Secondary | ICD-10-CM | POA: Insufficient documentation

## 2015-07-30 LAB — CBC
HCT: 33.2 % — ABNORMAL LOW (ref 35.0–47.0)
Hemoglobin: 10.8 g/dL — ABNORMAL LOW (ref 12.0–16.0)
MCH: 28.7 pg (ref 26.0–34.0)
MCHC: 32.6 g/dL (ref 32.0–36.0)
MCV: 88.1 fL (ref 80.0–100.0)
PLATELETS: 136 10*3/uL — AB (ref 150–440)
RBC: 3.77 MIL/uL — ABNORMAL LOW (ref 3.80–5.20)
RDW: 14.2 % (ref 11.5–14.5)
WBC: 8.5 10*3/uL (ref 3.6–11.0)

## 2015-07-30 LAB — TROPONIN I: TROPONIN I: 0.06 ng/mL — AB (ref <0.031)

## 2015-07-30 LAB — BASIC METABOLIC PANEL
Anion gap: 9 (ref 5–15)
BUN: 25 mg/dL — AB (ref 6–20)
CALCIUM: 8.7 mg/dL — AB (ref 8.9–10.3)
CO2: 19 mmol/L — ABNORMAL LOW (ref 22–32)
CREATININE: 1.45 mg/dL — AB (ref 0.44–1.00)
Chloride: 109 mmol/L (ref 101–111)
GFR calc Af Amer: 37 mL/min — ABNORMAL LOW (ref >60)
GFR, EST NON AFRICAN AMERICAN: 32 mL/min — AB (ref >60)
GLUCOSE: 127 mg/dL — AB (ref 65–99)
Potassium: 3.5 mmol/L (ref 3.5–5.1)
Sodium: 137 mmol/L (ref 135–145)

## 2015-07-30 MED ORDER — METOPROLOL TARTRATE 25 MG PO TABS: 25.0000 mg | ORAL_TABLET | Freq: Two times a day (BID) | ORAL | Status: DC

## 2015-07-30 MED ORDER — SIMVASTATIN 10 MG PO TABS: 10.0000 mg | ORAL_TABLET | Freq: Every day | ORAL | Status: DC

## 2015-07-30 MED ORDER — DEXTROSE 5 % IV SOLN: 1.0000 g | INTRAVENOUS | Status: DC

## 2015-07-30 NOTE — Progress Notes (Signed)
Report called to Kathryne Hitch, RN at [facility]

## 2015-07-30 NOTE — Progress Notes (Signed)
Zofran 4mg iv given for complaints of nausea 

## 2015-07-30 NOTE — Progress Notes (Signed)
Noted 13 x 10 hard hematoma on the left lower side of the abdomen. Patient c/o burning and stinging from it  and requested for Tylenol 650 mg . Ice is applied on it and Dr. Jannifer Franklin notified . No new order received, MD stated the RN should keep an eye on it and report if it gets bigger. No acute distress noted. Heparin drip reduced from 9 cc /hr to 7.5 cc/hr. Will continue to monitor.

## 2015-07-30 NOTE — Progress Notes (Signed)
[facility] here to transport pt to [facility]

## 2015-07-30 NOTE — Progress Notes (Signed)
SUBJECTIVE: Patient has abdominal pain at the site of the hematoma in the left lower abdomen but denies any chest pain   Filed Vitals:   07/29/15 1853 07/29/15 1939 07/30/15 0515 07/30/15 0827  BP: 127/77 130/78 119/67 121/75  Pulse: 67 74 70 103  Temp:  98.3 F (36.8 C) 98.4 F (36.9 C) 97.9 F (36.6 C)  TempSrc:  Oral Oral Oral  Resp:  18 20 17   Height:      Weight:      SpO2:  96% 100% 98%    Intake/Output Summary (Last 24 hours) at 07/30/15 0852 Last data filed at 07/30/15 0602  Gross per 24 hour  Intake 379.55 ml  Output    300 ml  Net  79.55 ml    LABS: Basic Metabolic Panel:  Recent Labs  07/28/15 1723 07/29/15 0658  NA 140 137  K 3.4* 3.5  CL 109 109  CO2 22 19*  GLUCOSE 111* 127*  BUN 29* 25*  CREATININE 1.53* 1.45*  CALCIUM 9.2 8.7*  MG 1.6*  --    Liver Function Tests: No results for input(s): AST, ALT, ALKPHOS, BILITOT, PROT, ALBUMIN in the last 72 hours. No results for input(s): LIPASE, AMYLASE in the last 72 hours. CBC:  Recent Labs  07/28/15 1723 07/29/15 0658  WBC 11.0 8.5  HGB 12.0 10.8*  HCT 35.8 33.2*  MCV 87.1 88.1  PLT 155 136*   Cardiac Enzymes:  Recent Labs  07/29/15 0002 07/29/15 0658 07/29/15 1859  TROPONINI 0.05* 0.06*  0.04* <0.03   BNP: Invalid input(s): POCBNP D-Dimer: No results for input(s): DDIMER in the last 72 hours. Hemoglobin A1C: No results for input(s): HGBA1C in the last 72 hours. Fasting Lipid Panel: No results for input(s): CHOL, HDL, LDLCALC, TRIG, CHOLHDL, LDLDIRECT in the last 72 hours. Thyroid Function Tests: No results for input(s): TSH, T4TOTAL, T3FREE, THYROIDAB in the last 72 hours.  Invalid input(s): FREET3 Anemia Panel: No results for input(s): VITAMINB12, FOLATE, FERRITIN, TIBC, IRON, RETICCTPCT in the last 72 hours.   PHYSICAL EXAM General: Well developed, well nourished, in no acute distress HEENT:  Normocephalic and atramatic Neck:  No JVD.  Lungs: Clear bilaterally to  auscultation and percussion. Heart: HRRR . Normal S1 and S2 without gallops or murmurs.  Abdomen: Bowel sounds are positive, abdomen soft and non-tender  Msk:  Back normal, normal gait. Normal strength and tone for age. Extremities: No clubbing, cyanosis or edema.   Neuro: Alert and oriented X 3. Psych:  Good affect, responds appropriately  TELEMETRY: Sinus rhythm with occasional supraventricular tachycardia  ASSESSMENT AND PLAN: Syncopal episode most likely secondary to coronary artery disease with possible high-grade lesion. She had mildly elevated troponin when she came suggestive of presumed coronary artery disease. According to the daughter she is scheduled for having catheterization next Friday at [facility] and wants to have it done over there. Thus will cancel cardiac catheterization here. Patient also has a hematoma on both sides of the lower abdomen but left greater than the right and it appears to be subcutaneous thus should not be on any anticoagulants or Plavix . We will DC the heparin. I feel that with everything going on just better that she be transferred to [facility] instead of being discharged. She may have another episode in between today and Friday such as SVT and syncopal episode. I explained all this to the daughter.  Active Problems:   Syncope    Dionisio David, MD, [facility] 07/30/2015 8:52  AM
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
## 2015-08-01 LAB — URINE CULTURE: Culture: >=100000

## 2015-08-07 ENCOUNTER — Encounter
Admission: RE | Admit: 2015-08-07 | Discharge: 2015-08-07 | Disposition: A | Discharge status: Home / Self Care | Payer: Medicare Other | Source: Ambulatory Visit | Attending: Internal Medicine | Admitting: Internal Medicine

## 2015-08-07 DIAGNOSIS — Z794 Long term (current) use of insulin: Secondary | ICD-10-CM | POA: Diagnosis not present

## 2015-08-07 DIAGNOSIS — E119 Type 2 diabetes mellitus without complications: Secondary | ICD-10-CM | POA: Insufficient documentation

## 2015-08-07 LAB — GLUCOSE, CAPILLARY: GLUCOSE-CAPILLARY: 153 mg/dL — AB (ref 65–99)

## 2015-08-08 DIAGNOSIS — E119 Type 2 diabetes mellitus without complications: Secondary | ICD-10-CM | POA: Diagnosis not present

## 2015-08-08 LAB — GLUCOSE, CAPILLARY
GLUCOSE-CAPILLARY: 105 mg/dL — AB (ref 65–99)
Glucose-Capillary: 124 mg/dL — ABNORMAL HIGH (ref 65–99)
Glucose-Capillary: 135 mg/dL — ABNORMAL HIGH (ref 65–99)

## 2015-08-09 DIAGNOSIS — E119 Type 2 diabetes mellitus without complications: Secondary | ICD-10-CM | POA: Diagnosis not present

## 2015-08-09 LAB — GLUCOSE, CAPILLARY: Glucose-Capillary: 98 mg/dL (ref 65–99)

## 2015-08-10 LAB — GLUCOSE, CAPILLARY
Glucose-Capillary: 114 mg/dL — ABNORMAL HIGH (ref 65–99)
Glucose-Capillary: 120 mg/dL — ABNORMAL HIGH (ref 65–99)

## 2015-08-11 DIAGNOSIS — E119 Type 2 diabetes mellitus without complications: Secondary | ICD-10-CM | POA: Diagnosis not present

## 2015-08-11 LAB — GLUCOSE, CAPILLARY
GLUCOSE-CAPILLARY: 127 mg/dL — AB (ref 65–99)
GLUCOSE-CAPILLARY: 119 mg/dL — AB (ref 65–99)
GLUCOSE-CAPILLARY: 82 mg/dL (ref 65–99)
GLUCOSE-CAPILLARY: 114 mg/dL — AB (ref 65–99)
Glucose-Capillary: 109 mg/dL — ABNORMAL HIGH (ref 65–99)
Glucose-Capillary: 133 mg/dL — ABNORMAL HIGH (ref 65–99)
Glucose-Capillary: 109 mg/dL — ABNORMAL HIGH (ref 65–99)

## 2015-08-12 ENCOUNTER — Encounter
Admission: RE | Admit: 2015-08-12 | Discharge: 2015-08-12 | Disposition: A | Discharge status: Home / Self Care | Payer: Medicare Other | Source: Ambulatory Visit | Attending: Internal Medicine | Admitting: Internal Medicine

## 2015-08-12 DIAGNOSIS — Z794 Long term (current) use of insulin: Secondary | ICD-10-CM | POA: Insufficient documentation

## 2015-08-12 DIAGNOSIS — E119 Type 2 diabetes mellitus without complications: Secondary | ICD-10-CM | POA: Insufficient documentation

## 2015-08-12 LAB — GLUCOSE, CAPILLARY: Glucose-Capillary: 98 mg/dL (ref 65–99)

## 2015-08-13 DIAGNOSIS — E119 Type 2 diabetes mellitus without complications: Secondary | ICD-10-CM | POA: Diagnosis not present

## 2015-08-13 LAB — GLUCOSE, CAPILLARY: GLUCOSE-CAPILLARY: 109 mg/dL — AB (ref 65–99)

## 2015-08-14 DIAGNOSIS — E119 Type 2 diabetes mellitus without complications: Secondary | ICD-10-CM | POA: Diagnosis not present

## 2015-08-15 DIAGNOSIS — E119 Type 2 diabetes mellitus without complications: Secondary | ICD-10-CM | POA: Diagnosis not present

## 2015-08-15 LAB — GLUCOSE, CAPILLARY: GLUCOSE-CAPILLARY: 103 mg/dL — AB (ref 65–99)

## 2015-08-16 LAB — GLUCOSE, CAPILLARY: Glucose-Capillary: 100 mg/dL — ABNORMAL HIGH (ref 65–99)

## 2015-10-10 DIAGNOSIS — M25561 Pain in right knee: Secondary | ICD-10-CM | POA: Insufficient documentation

## 2015-11-16 ENCOUNTER — Encounter: Payer: Self-pay | Admitting: Anesthesiology

## 2015-11-16 ENCOUNTER — Other Ambulatory Visit: Payer: Self-pay | Admitting: Neurological Surgery

## 2015-11-16 DIAGNOSIS — M5416 Radiculopathy, lumbar region: Secondary | ICD-10-CM

## 2015-11-17 ENCOUNTER — Inpatient Hospital Stay (HOSPITAL_COMMUNITY)
Admission: EM | Admit: 2015-11-17 | Discharge: 2015-11-19 | DRG: 552 | Disposition: A | Discharge status: Home Health | Payer: Medicare Other | Attending: Internal Medicine | Admitting: Internal Medicine

## 2015-11-17 ENCOUNTER — Ambulatory Visit: Payer: Medicare Other

## 2015-11-17 ENCOUNTER — Inpatient Hospital Stay (HOSPITAL_COMMUNITY): Payer: Medicare Other

## 2015-11-17 ENCOUNTER — Emergency Department (HOSPITAL_COMMUNITY): Payer: Medicare Other

## 2015-11-17 ENCOUNTER — Encounter (HOSPITAL_COMMUNITY): Payer: Self-pay | Admitting: *Deleted

## 2015-11-17 ENCOUNTER — Encounter (HOSPITAL_COMMUNITY): Payer: Self-pay | Admitting: Anesthesiology

## 2015-11-17 DIAGNOSIS — I4891 Unspecified atrial fibrillation: Secondary | ICD-10-CM | POA: Diagnosis present

## 2015-11-17 DIAGNOSIS — Z91013 Allergy to seafood: Secondary | ICD-10-CM

## 2015-11-17 DIAGNOSIS — R32 Unspecified urinary incontinence: Secondary | ICD-10-CM | POA: Diagnosis present

## 2015-11-17 DIAGNOSIS — M5440 Lumbago with sciatica, unspecified side: Principal | ICD-10-CM | POA: Diagnosis present

## 2015-11-17 DIAGNOSIS — M549 Dorsalgia, unspecified: Secondary | ICD-10-CM | POA: Diagnosis present

## 2015-11-17 DIAGNOSIS — M5416 Radiculopathy, lumbar region: Secondary | ICD-10-CM

## 2015-11-17 DIAGNOSIS — I129 Hypertensive chronic kidney disease with stage 1 through stage 4 chronic kidney disease, or unspecified chronic kidney disease: Secondary | ICD-10-CM | POA: Diagnosis present

## 2015-11-17 DIAGNOSIS — I10 Essential (primary) hypertension: Secondary | ICD-10-CM | POA: Diagnosis not present

## 2015-11-17 DIAGNOSIS — E119 Type 2 diabetes mellitus without complications: Secondary | ICD-10-CM | POA: Diagnosis present

## 2015-11-17 DIAGNOSIS — Z7982 Long term (current) use of aspirin: Secondary | ICD-10-CM | POA: Diagnosis not present

## 2015-11-17 DIAGNOSIS — N289 Disorder of kidney and ureter, unspecified: Secondary | ICD-10-CM

## 2015-11-17 DIAGNOSIS — I2510 Atherosclerotic heart disease of native coronary artery without angina pectoris: Secondary | ICD-10-CM | POA: Insufficient documentation

## 2015-11-17 DIAGNOSIS — I480 Paroxysmal atrial fibrillation: Secondary | ICD-10-CM

## 2015-11-17 DIAGNOSIS — E876 Hypokalemia: Secondary | ICD-10-CM | POA: Diagnosis present

## 2015-11-17 DIAGNOSIS — I482 Chronic atrial fibrillation: Secondary | ICD-10-CM | POA: Diagnosis not present

## 2015-11-17 DIAGNOSIS — Z79899 Other long term (current) drug therapy: Secondary | ICD-10-CM

## 2015-11-17 DIAGNOSIS — M545 Low back pain: Secondary | ICD-10-CM | POA: Diagnosis present

## 2015-11-17 DIAGNOSIS — N183 Chronic kidney disease, stage 3 (moderate): Secondary | ICD-10-CM | POA: Diagnosis present

## 2015-11-17 DIAGNOSIS — M4726 Other spondylosis with radiculopathy, lumbar region: Secondary | ICD-10-CM | POA: Diagnosis present

## 2015-11-17 DIAGNOSIS — M79604 Pain in right leg: Secondary | ICD-10-CM | POA: Diagnosis present

## 2015-11-17 DIAGNOSIS — M5441 Lumbago with sciatica, right side: Secondary | ICD-10-CM

## 2015-11-17 DIAGNOSIS — M5459 Other low back pain: Secondary | ICD-10-CM | POA: Diagnosis present

## 2015-11-17 HISTORY — DX: Atherosclerotic heart disease of native coronary artery without angina pectoris: I25.10

## 2015-11-17 HISTORY — DX: Wedge compression fracture of unspecified lumbar vertebra, initial encounter for closed fracture: S32.000A

## 2015-11-17 LAB — BASIC METABOLIC PANEL
Anion gap: 13 (ref 5–15)
BUN: 22 mg/dL — ABNORMAL HIGH (ref 6–20)
CHLORIDE: 103 mmol/L (ref 101–111)
CO2: 23 mmol/L (ref 22–32)
Calcium: 9 mg/dL (ref 8.9–10.3)
Creatinine, Ser: 1.61 mg/dL — ABNORMAL HIGH (ref 0.44–1.00)
GFR calc non Af Amer: 28 mL/min — ABNORMAL LOW (ref >60)
GFR, EST AFRICAN AMERICAN: 32 mL/min — AB (ref >60)
Glucose, Bld: 213 mg/dL — ABNORMAL HIGH (ref 65–99)
POTASSIUM: 3.3 mmol/L — AB (ref 3.5–5.1)
SODIUM: 139 mmol/L (ref 135–145)

## 2015-11-17 LAB — CBC
HEMATOCRIT: 35.9 % — AB (ref 36.0–46.0)
Hemoglobin: 11.4 g/dL — ABNORMAL LOW (ref 12.0–15.0)
MCH: 27.3 pg (ref 26.0–34.0)
MCHC: 31.8 g/dL (ref 30.0–36.0)
MCV: 86.1 fL (ref 78.0–100.0)
PLATELETS: 239 10*3/uL (ref 150–400)
RBC: 4.17 MIL/uL (ref 3.87–5.11)
RDW: 14.1 % (ref 11.5–15.5)
WBC: 7.5 10*3/uL (ref 4.0–10.5)

## 2015-11-17 LAB — GLUCOSE, CAPILLARY: Glucose-Capillary: 165 mg/dL — ABNORMAL HIGH (ref 65–99)

## 2015-11-17 LAB — TSH: TSH: 0.703 u[IU]/mL (ref 0.350–4.500)

## 2015-11-17 MED ORDER — HYDROMORPHONE HCL 1 MG/ML IJ SOLN
1.0000 mg | Freq: Once | INTRAMUSCULAR | Status: AC
  Administered 2015-11-17: 1 mg via INTRAMUSCULAR
  Filled 2015-11-17: qty 1

## 2015-11-17 MED ORDER — METHYLPREDNISOLONE ACETATE 80 MG/ML IJ SUSP
INTRAMUSCULAR | Status: AC
  Filled 2015-11-17: qty 1

## 2015-11-17 MED ORDER — METOPROLOL TARTRATE 1 MG/ML IV SOLN
5.0000 mg | INTRAVENOUS | Status: DC | PRN
  Administered 2015-11-18: 5 mg via INTRAVENOUS
  Filled 2015-11-17: qty 5

## 2015-11-17 MED ORDER — INSULIN ASPART 100 UNIT/ML ~~LOC~~ SOLN: 0.0000 [IU] | Freq: Three times a day (TID) | SUBCUTANEOUS | Status: DC

## 2015-11-17 MED ORDER — ONDANSETRON HCL 4 MG/2ML IJ SOLN: 4.0000 mg | Freq: Three times a day (TID) | INTRAMUSCULAR | Status: DC | PRN

## 2015-11-17 MED ORDER — INSULIN ASPART 100 UNIT/ML ~~LOC~~ SOLN: 0.0000 [IU] | Freq: Every day | SUBCUTANEOUS | Status: DC

## 2015-11-17 MED ORDER — METOPROLOL TARTRATE 25 MG PO TABS
12.5000 mg | ORAL_TABLET | Freq: Once | ORAL | Status: AC
  Administered 2015-11-17: 12.5 mg via ORAL
  Filled 2015-11-17: qty 1

## 2015-11-17 MED ORDER — SODIUM CHLORIDE 0.9 % IV BOLUS (SEPSIS)
500.0000 mL | Freq: Once | INTRAVENOUS | Status: AC
  Administered 2015-11-17: 500 mL via INTRAVENOUS

## 2015-11-17 MED ORDER — SODIUM CHLORIDE 0.9 % IJ SOLN
INTRAMUSCULAR | Status: AC
  Filled 2015-11-17: qty 10

## 2015-11-17 MED ORDER — LIDOCAINE HCL 1 % IJ SOLN
INTRAMUSCULAR | Status: AC
  Filled 2015-11-17: qty 20

## 2015-11-17 MED ORDER — HYDROMORPHONE HCL 1 MG/ML IJ SOLN
0.5000 mg | INTRAMUSCULAR | Status: DC | PRN
  Administered 2015-11-17: 0.5 mg via INTRAVENOUS
  Filled 2015-11-17: qty 1

## 2015-11-17 MED ORDER — SODIUM CHLORIDE 0.9 % IV SOLN
INTRAVENOUS | Status: DC
  Administered 2015-11-18 (×2): via INTRAVENOUS

## 2015-11-17 MED ORDER — HYDROMORPHONE HCL 1 MG/ML IJ SOLN: 1.0000 mg | INTRAMUSCULAR | Status: DC | PRN

## 2015-11-17 MED ORDER — METHYLPREDNISOLONE ACETATE 40 MG/ML IJ SUSP
INTRAMUSCULAR | Status: AC
  Filled 2015-11-17: qty 1
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
## 2015-11-17 NOTE — ED Notes (Signed)
Attempted report x1. 
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
## 2015-11-17 NOTE — ED Notes (Signed)
Pt returned from MRI on stretcher, is sleepy but able to awaken and converse.  Pt denies any pain at present.

## 2015-11-17 NOTE — ED Notes (Signed)
Per family pt was seen at [facility] ED on Monday for atrial fibrillation and syncope. Today also reports severe back pain radiating to both legs, also states urinary incontinence. afib on monitor at present. Respirations unlabored. Denies chest pain at present. Pt is alert and answering questions appropriately.

## 2015-11-17 NOTE — ED Notes (Signed)
Pt in MRI.

## 2015-11-17 NOTE — ED Notes (Signed)
Patient transported to MRI on stretcher.
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
## 2015-11-17 NOTE — ED Notes (Signed)
Daughter Jamse Arn Day [phone]) is taking pt belongings. Daughter is an employee in the [employer] and sts to call her if we needed her.

## 2015-11-17 NOTE — ED Notes (Signed)
Per family, pt sent here for possible admission due to syncope and afib rvr. ekg done at triage, HR irregular, 100-120 at triage.

## 2015-11-18 DIAGNOSIS — M545 Low back pain: Secondary | ICD-10-CM

## 2015-11-18 DIAGNOSIS — I482 Chronic atrial fibrillation: Secondary | ICD-10-CM

## 2015-11-18 DIAGNOSIS — N289 Disorder of kidney and ureter, unspecified: Secondary | ICD-10-CM

## 2015-11-18 LAB — BASIC METABOLIC PANEL
ANION GAP: 10 (ref 5–15)
BUN: 27 mg/dL — ABNORMAL HIGH (ref 6–20)
CHLORIDE: 108 mmol/L (ref 101–111)
CO2: 22 mmol/L (ref 22–32)
Calcium: 8.7 mg/dL — ABNORMAL LOW (ref 8.9–10.3)
Creatinine, Ser: 1.44 mg/dL — ABNORMAL HIGH (ref 0.44–1.00)
GFR calc non Af Amer: 32 mL/min — ABNORMAL LOW (ref >60)
GFR, EST AFRICAN AMERICAN: 37 mL/min — AB (ref >60)
GLUCOSE: 149 mg/dL — AB (ref 65–99)
Potassium: 3.4 mmol/L — ABNORMAL LOW (ref 3.5–5.1)
Sodium: 140 mmol/L (ref 135–145)

## 2015-11-18 LAB — C DIFFICILE QUICK SCREEN W PCR REFLEX
C DIFFICLE (CDIFF) ANTIGEN: NEGATIVE
C Diff interpretation: NEGATIVE
C Diff toxin: NEGATIVE

## 2015-11-18 LAB — CBC
HCT: 32 % — ABNORMAL LOW (ref 36.0–46.0)
HEMOGLOBIN: 10.3 g/dL — AB (ref 12.0–15.0)
MCH: 27.5 pg (ref 26.0–34.0)
MCHC: 32.2 g/dL (ref 30.0–36.0)
MCV: 85.3 fL (ref 78.0–100.0)
PLATELETS: 198 10*3/uL (ref 150–400)
RBC: 3.75 MIL/uL — ABNORMAL LOW (ref 3.87–5.11)
RDW: 14.2 % (ref 11.5–15.5)
WBC: 5.8 10*3/uL (ref 4.0–10.5)

## 2015-11-18 LAB — GLUCOSE, CAPILLARY
GLUCOSE-CAPILLARY: 120 mg/dL — AB (ref 65–99)
GLUCOSE-CAPILLARY: 116 mg/dL — AB (ref 65–99)
Glucose-Capillary: 117 mg/dL — ABNORMAL HIGH (ref 65–99)
Glucose-Capillary: 119 mg/dL — ABNORMAL HIGH (ref 65–99)

## 2015-11-18 MED ORDER — TRAMADOL HCL 50 MG PO TABS
50.0000 mg | ORAL_TABLET | Freq: Four times a day (QID) | ORAL | Status: DC | PRN
  Administered 2015-11-18: 50 mg via ORAL
  Filled 2015-11-18: qty 1

## 2015-11-18 MED ORDER — GUAIFENESIN ER 600 MG PO TB12
600.0000 mg | ORAL_TABLET | Freq: Two times a day (BID) | ORAL | Status: DC
  Administered 2015-11-18 – 2015-11-19 (×3): 600 mg via ORAL
  Filled 2015-11-18 (×3): qty 1

## 2015-11-18 MED ORDER — LOPERAMIDE HCL 2 MG PO CAPS
2.0000 mg | ORAL_CAPSULE | ORAL | Status: DC | PRN
  Administered 2015-11-18: 2 mg via ORAL
  Filled 2015-11-18: qty 1

## 2015-11-18 MED ORDER — ATORVASTATIN CALCIUM 20 MG PO TABS
20.0000 mg | ORAL_TABLET | Freq: Every day | ORAL | Status: DC
  Administered 2015-11-18: 20 mg via ORAL
  Filled 2015-11-18: qty 1

## 2015-11-18 MED ORDER — ASPIRIN EC 325 MG PO TBEC
325.0000 mg | DELAYED_RELEASE_TABLET | Freq: Every evening | ORAL | Status: DC
  Administered 2015-11-18: 325 mg via ORAL
  Filled 2015-11-18: qty 1

## 2015-11-18 MED ORDER — METOPROLOL SUCCINATE ER 25 MG PO TB24
25.0000 mg | ORAL_TABLET | Freq: Every day | ORAL | Status: DC
  Administered 2015-11-18 – 2015-11-19 (×2): 25 mg via ORAL
  Filled 2015-11-18 (×2): qty 1

## 2015-11-18 MED ORDER — POTASSIUM CHLORIDE CRYS ER 20 MEQ PO TBCR
40.0000 meq | EXTENDED_RELEASE_TABLET | Freq: Once | ORAL | Status: AC
  Administered 2015-11-18: 40 meq via ORAL
  Filled 2015-11-18: qty 2

## 2015-11-18 MED ORDER — DIAZEPAM 2 MG PO TABS: 2.0000 mg | ORAL_TABLET | Freq: Four times a day (QID) | ORAL | Status: DC | PRN
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
## 2015-11-18 NOTE — Progress Notes (Signed)
Patient ambulated to bathroom with one assist. Complaining of slight pain, paged physician. Daughter at bedside, call light within reach.
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
## 2015-11-19 DIAGNOSIS — I4891 Unspecified atrial fibrillation: Secondary | ICD-10-CM | POA: Insufficient documentation

## 2015-11-19 LAB — GLUCOSE, CAPILLARY
GLUCOSE-CAPILLARY: 95 mg/dL (ref 65–99)
Glucose-Capillary: 115 mg/dL — ABNORMAL HIGH (ref 65–99)

## 2015-11-19 MED ORDER — IRBESARTAN 75 MG PO TABS: 75.0000 mg | ORAL_TABLET | Freq: Every day | ORAL | Status: DC

## 2015-11-19 NOTE — Progress Notes (Signed)
Reviewed discharge instructions with patient and family. IV removed. Awaiting discharge via a wheelchair.  Roselina Burgueno, Mervin Kung RN

## 2015-11-19 NOTE — Care Management Note (Addendum)
Case Management Note  Patient Details  Name: Elizabeth Ford MRN: [id number] Date of Birth: 1926-10-20  Subjective/Objective:   82 y.o. F who is accompanied by daughter Elizabeth Ford [phone]. Daughter reports they have used [facility] in past and would like them again. All info faxed to [facility] office with confirmation.                 Action/Plan: Anticipate discharge home today. No further CM needs but will be available should additional discharge needs arise.   Expected Discharge Date:                  Expected Discharge Plan:  [place]  In-House Referral:     Discharge planning Services  CM Consult  Post Acute Care Choice:    Choice offered to:  Patient, Adult Children Elizabeth Ford [phone])  DME Arranged:    DME Agency:     HH Arranged:  PT [place]:  [place]  Status of Service:  Completed, signed off  Medicare Important Message Given:    Date Medicare IM Given:    Medicare IM give by:    Date Additional Medicare IM Given:    Additional Medicare Important Message give by:     If discussed at [place] of Stay Meetings, dates discussed:    Additional Comments:  Delrae Sawyers, RN 11/19/2015, 12:40 PM
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
## 2015-11-20 LAB — HEMOGLOBIN A1C
HEMOGLOBIN A1C: 7.1 % — AB (ref 4.8–5.6)
Mean Plasma Glucose: 157 mg/dL

## 2015-11-21 NOTE — Progress Notes (Signed)
Utilization review completed- completed post discharge 

## 2015-12-04 DIAGNOSIS — D735 Infarction of spleen: Secondary | ICD-10-CM | POA: Insufficient documentation

## 2015-12-08 ENCOUNTER — Encounter
Admission: RE | Admit: 2015-12-08 | Discharge: 2015-12-08 | Disposition: A | Discharge status: Home / Self Care | Payer: Medicare Other | Source: Ambulatory Visit | Attending: Internal Medicine | Admitting: Internal Medicine

## 2015-12-08 DIAGNOSIS — E119 Type 2 diabetes mellitus without complications: Secondary | ICD-10-CM | POA: Insufficient documentation

## 2015-12-11 DIAGNOSIS — E119 Type 2 diabetes mellitus without complications: Secondary | ICD-10-CM | POA: Diagnosis present

## 2015-12-11 LAB — GLUCOSE, CAPILLARY
GLUCOSE-CAPILLARY: 154 mg/dL — AB (ref 65–99)
Glucose-Capillary: 112 mg/dL — ABNORMAL HIGH (ref 65–99)
Glucose-Capillary: 145 mg/dL — ABNORMAL HIGH (ref 65–99)

## 2015-12-12 DIAGNOSIS — I7103 Dissection of thoracoabdominal aorta: Secondary | ICD-10-CM | POA: Insufficient documentation

## 2015-12-12 DIAGNOSIS — E119 Type 2 diabetes mellitus without complications: Secondary | ICD-10-CM | POA: Diagnosis not present

## 2015-12-12 LAB — GLUCOSE, CAPILLARY
GLUCOSE-CAPILLARY: 136 mg/dL — AB (ref 65–99)
Glucose-Capillary: 126 mg/dL — ABNORMAL HIGH (ref 65–99)

## 2015-12-13 ENCOUNTER — Encounter
Admission: RE | Admit: 2015-12-13 | Discharge: 2015-12-13 | Disposition: A | Discharge status: Home / Self Care | Payer: Medicare Other | Source: Ambulatory Visit | Attending: Internal Medicine | Admitting: Internal Medicine

## 2015-12-13 DIAGNOSIS — E119 Type 2 diabetes mellitus without complications: Secondary | ICD-10-CM | POA: Insufficient documentation

## 2015-12-15 DIAGNOSIS — E119 Type 2 diabetes mellitus without complications: Secondary | ICD-10-CM | POA: Diagnosis not present

## 2015-12-15 LAB — GLUCOSE, CAPILLARY
GLUCOSE-CAPILLARY: 146 mg/dL — AB (ref 65–99)
GLUCOSE-CAPILLARY: 300 mg/dL — AB (ref 65–99)
GLUCOSE-CAPILLARY: 129 mg/dL — AB (ref 65–99)
GLUCOSE-CAPILLARY: 174 mg/dL — AB (ref 65–99)
Glucose-Capillary: 118 mg/dL — ABNORMAL HIGH (ref 65–99)
Glucose-Capillary: 137 mg/dL — ABNORMAL HIGH (ref 65–99)

## 2015-12-16 DIAGNOSIS — E119 Type 2 diabetes mellitus without complications: Secondary | ICD-10-CM | POA: Diagnosis not present

## 2015-12-16 LAB — GLUCOSE, CAPILLARY
GLUCOSE-CAPILLARY: 121 mg/dL — AB (ref 65–99)
GLUCOSE-CAPILLARY: 133 mg/dL — AB (ref 65–99)

## 2015-12-17 DIAGNOSIS — E119 Type 2 diabetes mellitus without complications: Secondary | ICD-10-CM | POA: Diagnosis not present

## 2015-12-17 LAB — GLUCOSE, CAPILLARY
GLUCOSE-CAPILLARY: 108 mg/dL — AB (ref 65–99)
Glucose-Capillary: 133 mg/dL — ABNORMAL HIGH (ref 65–99)

## 2015-12-18 DIAGNOSIS — E119 Type 2 diabetes mellitus without complications: Secondary | ICD-10-CM | POA: Diagnosis not present

## 2015-12-18 LAB — GLUCOSE, CAPILLARY
GLUCOSE-CAPILLARY: 100 mg/dL — AB (ref 65–99)
Glucose-Capillary: 116 mg/dL — ABNORMAL HIGH (ref 65–99)

## 2015-12-19 DIAGNOSIS — E119 Type 2 diabetes mellitus without complications: Secondary | ICD-10-CM | POA: Diagnosis not present

## 2015-12-19 LAB — GLUCOSE, CAPILLARY: GLUCOSE-CAPILLARY: 104 mg/dL — AB (ref 65–99)

## 2015-12-20 DIAGNOSIS — I7100 Dissection of unspecified site of aorta: Secondary | ICD-10-CM | POA: Insufficient documentation

## 2015-12-20 LAB — GLUCOSE, CAPILLARY
GLUCOSE-CAPILLARY: 140 mg/dL — AB (ref 65–99)
GLUCOSE-CAPILLARY: 131 mg/dL — AB (ref 65–99)

## 2016-02-14 DIAGNOSIS — R339 Retention of urine, unspecified: Secondary | ICD-10-CM | POA: Insufficient documentation

## 2016-04-16 DIAGNOSIS — N819 Female genital prolapse, unspecified: Secondary | ICD-10-CM | POA: Insufficient documentation

## 2016-07-01 DIAGNOSIS — M791 Myalgia, unspecified site: Secondary | ICD-10-CM | POA: Insufficient documentation

## 2016-12-03 DIAGNOSIS — M25519 Pain in unspecified shoulder: Secondary | ICD-10-CM | POA: Insufficient documentation

## 2018-02-05 ENCOUNTER — Ambulatory Visit: Payer: Medicare Other | Admitting: Podiatry

## 2018-02-05 ENCOUNTER — Encounter: Payer: Self-pay | Admitting: Podiatry

## 2018-02-05 VITALS — BP 133/62 | HR 58

## 2018-02-05 DIAGNOSIS — L84 Corns and callosities: Secondary | ICD-10-CM

## 2018-02-05 DIAGNOSIS — Q828 Other specified congenital malformations of skin: Secondary | ICD-10-CM | POA: Diagnosis not present

## 2018-02-05 NOTE — Progress Notes (Signed)
This patient presents to the office with chief complaint of painful calluses and corns and diabetic feet. She says he is having pain in her feet walking and wearing her shoes..  These calluses and corns  are painful walking and wearing his shoes. She has no history of infection or drainage from his feet.  She presents the office today for treatment of her  and a foot evaluation due to her diabetes.  General Appearance  Alert, conversant and in no acute stress.  Vascular  Dorsalis pedis and posterior tibial  pulses are weakly  palpable  bilaterally.  Capillary return is within normal limits  bilaterally. Temperature is within normal limits  bilaterally.  Neurologic  Senn-Weinstein monofilament wire test within normal limits  bilaterally. Muscle power within normal limits bilaterally.  Nails Thick disfigured discolored nails with subungual debris  from hallux to fifth toes bilaterally. No pain associated with nails. No evidence of bacterial infection or drainage bilaterally.  Orthopedic  No limitations of motion of motion feet .  No crepitus or effusions noted.  Hammer toes 5th digits  B/L. Mild  HAV  B/L.  Hammer toes 2-5  B/L.  Skin  normotropic skin noted.  Porokeratosis sub 5th left sub 2,3 left and sub 4th right.  Heloma durum fifth toe  B/l  No signs of infections or ulcers noted.    Porokeratosis  B/L  Callus fifth toes  B/L  Diabetes with no foot complications  IE  Debride porokeratosis  B/L and HD 5th  B/L.Marland Kitchen  A diabetic foot exam was performed and there is no evidence of any vascular or neurologic pathology.   RTC prn.   Gardiner Barefoot DPM

## 2018-04-13 ENCOUNTER — Ambulatory Visit: Payer: Medicare Other | Admitting: Podiatry

## 2018-04-13 ENCOUNTER — Encounter: Payer: Self-pay | Admitting: Podiatry

## 2018-04-13 DIAGNOSIS — Q828 Other specified congenital malformations of skin: Secondary | ICD-10-CM | POA: Diagnosis not present

## 2018-04-13 DIAGNOSIS — E119 Type 2 diabetes mellitus without complications: Secondary | ICD-10-CM | POA: Diagnosis not present

## 2018-04-13 DIAGNOSIS — D689 Coagulation defect, unspecified: Secondary | ICD-10-CM

## 2018-04-13 DIAGNOSIS — M79675 Pain in left toe(s): Secondary | ICD-10-CM

## 2018-04-13 DIAGNOSIS — B351 Tinea unguium: Secondary | ICD-10-CM

## 2018-04-13 DIAGNOSIS — M79674 Pain in right toe(s): Secondary | ICD-10-CM

## 2018-04-13 DIAGNOSIS — L84 Corns and callosities: Secondary | ICD-10-CM

## 2018-04-13 NOTE — Progress Notes (Signed)
Complaint:  Visit Type: Patient returns to my office for continued preventative foot care services. Complaint: Patient states" my nails have grown long and thick and become painful to walk and wear shoes" Patient has been diagnosed with DM with no foot complications. The patient presents for preventative foot care services. No changes to ROS  Podiatric Exam: Vascular: dorsalis pedis and posterior tibial pulses are palpable bilateral. Capillary return is immediate. Temperature gradient is WNL. Skin turgor WNL  Sensorium: Normal Semmes Weinstein monofilament test. Normal tactile sensation bilaterally. Nail Exam: Pt has thick disfigured discolored nails with subungual debris noted bilateral entire nail hallux through fifth toenails Ulcer Exam: There is no evidence of ulcer or pre-ulcerative changes or infection. Orthopedic Exam: Muscle tone and strength are WNL. No limitations in general ROM. No crepitus or effusions noted. Foot type and digits show no abnormalities. Bony prominences are unremarkable. Skin:  Porokeratosis sub-fourth right, sub-first and fourth left. Pinch callus left. Heloma durum fifth bilateral and a heloma molle, fourth and fifth right foot.   No infection or ulcers.    Diagnosis:  Onychomycosis, , Pain in right toe, pain in left toes,  Porokeratosis  Callus/corn  Treatment & Plan Procedures and Treatment: Consent by patient was obtained for treatment procedures.   Debridement of mycotic and hypertrophic toenails, 1 through 5 bilateral and clearing of subungual debris. No ulceration, no infection noted. Debride callus/corn Return Visit-Office Procedure: Patient instructed to return to the office for a follow up visit 3 months for continued evaluation and treatment.    Gardiner Barefoot DPM

## 2018-07-27 ENCOUNTER — Encounter: Payer: Self-pay | Admitting: Podiatry

## 2018-07-27 ENCOUNTER — Ambulatory Visit: Payer: Medicare Other | Admitting: Podiatry

## 2018-07-27 DIAGNOSIS — M79674 Pain in right toe(s): Secondary | ICD-10-CM

## 2018-07-27 DIAGNOSIS — M79675 Pain in left toe(s): Secondary | ICD-10-CM | POA: Diagnosis not present

## 2018-07-27 DIAGNOSIS — B351 Tinea unguium: Secondary | ICD-10-CM

## 2018-07-27 DIAGNOSIS — E119 Type 2 diabetes mellitus without complications: Secondary | ICD-10-CM | POA: Diagnosis not present

## 2018-07-27 DIAGNOSIS — Q828 Other specified congenital malformations of skin: Secondary | ICD-10-CM | POA: Diagnosis not present

## 2018-07-27 DIAGNOSIS — L84 Corns and callosities: Secondary | ICD-10-CM | POA: Diagnosis not present

## 2018-07-27 NOTE — Progress Notes (Signed)
Complaint:  Visit Type: Patient returns to my office for continued preventative foot care services. Complaint: Patient states" my nails have grown long and thick and become painful to walk and wear shoes" Patient has been diagnosed with DM with no foot complications. The patient presents for preventative foot care services. No changes to ROS  Podiatric Exam: Vascular: dorsalis pedis and posterior tibial pulses are palpable bilateral. Capillary return is immediate. Temperature gradient is WNL. Skin turgor WNL  Sensorium: Normal Semmes Weinstein monofilament test. Normal tactile sensation bilaterally. Nail Exam: Pt has thick disfigured discolored nails with subungual debris noted bilateral entire nail hallux through fifth toenails Ulcer Exam: There is no evidence of ulcer or pre-ulcerative changes or infection. Orthopedic Exam: Muscle tone and strength are WNL. No limitations in general ROM. No crepitus or effusions noted. Foot type and digits show no abnormalities. Bony prominences are unremarkable. Skin:  Porokeratosis sub-fourth right, sub-first and fourth left. Pinch callus left. Heloma durum fifth bilateral and a heloma molle, fourth and fifth right foot.   No infection or ulcers.    Diagnosis:  Onychomycosis, , Pain in right toe, pain in left toes,  Porokeratosis  Callus/corn  Treatment & Plan Procedures and Treatment: Consent by patient was obtained for treatment procedures.   Debridement of mycotic and hypertrophic toenails, 1 through 5 bilateral and clearing of subungual debris. No ulceration, no infection noted. Debride callus/corn Return Visit-Office Procedure: Patient instructed to return to the office for a follow up visit 10 weeks  for continued evaluation and treatment.    Demia Viera DPM 

## 2018-10-05 ENCOUNTER — Ambulatory Visit: Payer: Medicare Other | Admitting: Podiatry

## 2018-10-05 ENCOUNTER — Encounter: Payer: Self-pay | Admitting: Podiatry

## 2018-10-05 DIAGNOSIS — M79675 Pain in left toe(s): Secondary | ICD-10-CM | POA: Diagnosis not present

## 2018-10-05 DIAGNOSIS — B351 Tinea unguium: Secondary | ICD-10-CM

## 2018-10-05 DIAGNOSIS — M79674 Pain in right toe(s): Secondary | ICD-10-CM

## 2018-10-05 DIAGNOSIS — E119 Type 2 diabetes mellitus without complications: Secondary | ICD-10-CM

## 2018-10-05 DIAGNOSIS — L84 Corns and callosities: Secondary | ICD-10-CM

## 2018-10-05 DIAGNOSIS — Q828 Other specified congenital malformations of skin: Secondary | ICD-10-CM | POA: Diagnosis not present

## 2018-10-05 DIAGNOSIS — D689 Coagulation defect, unspecified: Secondary | ICD-10-CM

## 2018-10-05 NOTE — Progress Notes (Signed)
Complaint:  Visit Type: Patient returns to my office for continued preventative foot care services. Complaint: Patient states" my nails have grown long and thick and become painful to walk and wear shoes" Patient has been diagnosed with DM with no foot complications. The patient presents for preventative foot care services. No changes to ROS  Podiatric Exam: Vascular: dorsalis pedis and posterior tibial pulses are palpable bilateral. Capillary return is immediate. Temperature gradient is WNL. Skin turgor WNL  Sensorium: Normal Semmes Weinstein monofilament test. Normal tactile sensation bilaterally. Nail Exam: Pt has thick disfigured discolored nails with subungual debris noted bilateral entire nail hallux through fifth toenails Ulcer Exam: There is no evidence of ulcer or pre-ulcerative changes or infection. Orthopedic Exam: Muscle tone and strength are WNL. No limitations in general ROM. No crepitus or effusions noted. Foot type and digits show no abnormalities. Bony prominences are unremarkable. Skin:  Porokeratosis sub-fourth right, sub-first and fourth left. Pinch callus left. Heloma durum fifth bilateral and a heloma molle, fourth and fifth right foot.   No infection or ulcers.    Diagnosis:  Onychomycosis, , Pain in right toe, pain in left toes,  Porokeratosis  Callus/corn  Treatment & Plan Procedures and Treatment: Consent by patient was obtained for treatment procedures.   Debridement of mycotic and hypertrophic toenails, 1 through 5 bilateral and clearing of subungual debris. No ulceration, no infection noted. Debride callus/corn Return Visit-Office Procedure: Patient instructed to return to the office for a follow up visit 10 weeks  for continued evaluation and treatment.    Gardiner Barefoot DPM

## 2018-12-14 ENCOUNTER — Encounter: Payer: Self-pay | Admitting: Podiatry

## 2018-12-14 ENCOUNTER — Ambulatory Visit: Payer: Medicare Other | Admitting: Podiatry

## 2018-12-14 DIAGNOSIS — M79674 Pain in right toe(s): Secondary | ICD-10-CM

## 2018-12-14 DIAGNOSIS — E119 Type 2 diabetes mellitus without complications: Secondary | ICD-10-CM | POA: Diagnosis not present

## 2018-12-14 DIAGNOSIS — M79675 Pain in left toe(s): Secondary | ICD-10-CM

## 2018-12-14 DIAGNOSIS — L84 Corns and callosities: Secondary | ICD-10-CM

## 2018-12-14 DIAGNOSIS — Q828 Other specified congenital malformations of skin: Secondary | ICD-10-CM | POA: Diagnosis not present

## 2018-12-14 DIAGNOSIS — B351 Tinea unguium: Secondary | ICD-10-CM

## 2018-12-14 DIAGNOSIS — D689 Coagulation defect, unspecified: Secondary | ICD-10-CM

## 2018-12-14 NOTE — Progress Notes (Signed)
Complaint:  Visit Type: Patient returns to my office for continued preventative foot care services. Complaint: Patient states" my nails have grown long and thick and become painful to walk and wear shoes" Patient has been diagnosed with DM with no foot complications. The patient presents for preventative foot care services. No changes to ROS  Podiatric Exam: Vascular: dorsalis pedis and posterior tibial pulses are palpable bilateral. Capillary return is immediate. Temperature gradient is WNL. Skin turgor WNL  Sensorium: Normal Semmes Weinstein monofilament test. Normal tactile sensation bilaterally. Nail Exam: Pt has thick disfigured discolored nails with subungual debris noted bilateral entire nail hallux through fifth toenails Ulcer Exam: There is no evidence of ulcer or pre-ulcerative changes or infection. Orthopedic Exam: Muscle tone and strength are WNL. No limitations in general ROM. No crepitus or effusions noted. Foot type and digits show no abnormalities. Bony prominences are unremarkable. Skin:  Porokeratosis sub-fourth right, sub-first and fourth left. Pinch callus left. Heloma durum fifth bilateral and a heloma molle,    No infection or ulcers.    Diagnosis:  Onychomycosis, , Pain in right toe, pain in left toes,  Porokeratosis  Callus/corn  Treatment & Plan Procedures and Treatment: Consent by patient was obtained for treatment procedures.   Debridement of mycotic and hypertrophic toenails, 1 through 5 bilateral and clearing of subungual debris. No ulceration, no infection noted. Debride callus/corn Return Visit-Office Procedure: Patient instructed to return to the office for a follow up visit 10 weeks  for continued evaluation and treatment.    Gardiner Barefoot DPM

## 2019-02-22 ENCOUNTER — Encounter: Payer: Self-pay | Admitting: Podiatry

## 2019-02-22 ENCOUNTER — Other Ambulatory Visit: Payer: Self-pay

## 2019-02-22 ENCOUNTER — Ambulatory Visit: Payer: Medicare Other | Admitting: Podiatry

## 2019-02-22 VITALS — Temp 98.7°F

## 2019-02-22 DIAGNOSIS — L84 Corns and callosities: Secondary | ICD-10-CM

## 2019-02-22 DIAGNOSIS — Q828 Other specified congenital malformations of skin: Secondary | ICD-10-CM

## 2019-02-22 DIAGNOSIS — M79674 Pain in right toe(s): Secondary | ICD-10-CM

## 2019-02-22 DIAGNOSIS — B351 Tinea unguium: Secondary | ICD-10-CM | POA: Diagnosis not present

## 2019-02-22 DIAGNOSIS — E119 Type 2 diabetes mellitus without complications: Secondary | ICD-10-CM | POA: Diagnosis not present

## 2019-02-22 DIAGNOSIS — M79675 Pain in left toe(s): Secondary | ICD-10-CM

## 2019-02-22 DIAGNOSIS — D689 Coagulation defect, unspecified: Secondary | ICD-10-CM | POA: Diagnosis not present

## 2019-02-22 NOTE — Progress Notes (Signed)
Complaint:  Visit Type: Patient returns to my office for continued preventative foot care services. Complaint: Patient states" my nails have grown long and thick and become painful to walk and wear shoes" Patient has been diagnosed with DM with no foot complications. The patient presents for preventative foot care services. No changes to ROS.  Patient has pain noted in callus especially 4/5 right foot. Patient is taking coumadin.  Podiatric Exam: Vascular: dorsalis pedis and posterior tibial pulses are palpable bilateral. Capillary return is immediate. Temperature gradient is WNL. Skin turgor WNL  Sensorium: Normal Semmes Weinstein monofilament test. Normal tactile sensation bilaterally. Nail Exam: Pt has thick disfigured discolored nails with subungual debris noted bilateral entire nail hallux through fifth toenails Ulcer Exam: There is no evidence of ulcer or pre-ulcerative changes or infection. Orthopedic Exam: Muscle tone and strength are WNL. No limitations in general ROM. No crepitus or effusions noted. Foot type and digits show no abnormalities. Bony prominences are unremarkable. Skin:  Porokeratosis sub-fourth right, sub-first and fourth left. Pinch callus left. Heloma durum fifth bilateral and a heloma molle  4/5 right.,    No infection or ulcers.    Diagnosis:  Onychomycosis, , Pain in right toe, pain in left toes,  Porokeratosis  Callus/corn  Treatment & Plan Procedures and Treatment: Consent by patient was obtained for treatment procedures.   Debridement of mycotic and hypertrophic toenails, 1 through 5 bilateral and clearing of subungual debris. No ulceration, no infection noted. Debride callus/corn.  Injection therapy fifth toe right foot. Return Visit-Office Procedure: Patient instructed to return to the office for a follow up visit 10 weeks  for continued evaluation and treatment.    Gardiner Barefoot DPM

## 2019-04-26 ENCOUNTER — Other Ambulatory Visit: Payer: Self-pay

## 2019-04-26 ENCOUNTER — Ambulatory Visit: Payer: Medicare Other | Admitting: Podiatry

## 2019-04-26 ENCOUNTER — Encounter: Payer: Self-pay | Admitting: Podiatry

## 2019-04-26 DIAGNOSIS — B351 Tinea unguium: Secondary | ICD-10-CM | POA: Diagnosis not present

## 2019-04-26 DIAGNOSIS — M79674 Pain in right toe(s): Secondary | ICD-10-CM

## 2019-04-26 DIAGNOSIS — E119 Type 2 diabetes mellitus without complications: Secondary | ICD-10-CM | POA: Diagnosis not present

## 2019-04-26 DIAGNOSIS — M79675 Pain in left toe(s): Secondary | ICD-10-CM | POA: Diagnosis not present

## 2019-04-26 DIAGNOSIS — Q828 Other specified congenital malformations of skin: Secondary | ICD-10-CM | POA: Insufficient documentation

## 2019-04-26 DIAGNOSIS — L84 Corns and callosities: Secondary | ICD-10-CM | POA: Insufficient documentation

## 2019-04-26 NOTE — Progress Notes (Signed)
Complaint:  Visit Type: Patient returns to my office for continued preventative foot care services. Complaint: Patient states" my nails have grown long and thick and become painful to walk and wear shoes" Patient has been diagnosed with DM with no foot complications. The patient presents for preventative foot care services. No changes to ROS.  Patient has pain noted fifth toes  Both feet.  Painful callus on bottom of both feet. Patient is taking coumadin.  Podiatric Exam: Vascular: dorsalis pedis and posterior tibial pulses are palpable bilateral. Capillary return is immediate. Temperature gradient is WNL. Skin turgor WNL  Sensorium: Normal Semmes Weinstein monofilament test. Normal tactile sensation bilaterally. Nail Exam: Pt has thick disfigured discolored nails with subungual debris noted bilateral entire nail hallux through fifth toenails Ulcer Exam: There is no evidence of ulcer or pre-ulcerative changes or infection. Orthopedic Exam: Muscle tone and strength are WNL. No limitations in general ROM. No crepitus or effusions noted. Foot type and digits show no abnormalities. Bony prominences are unremarkable. Skin:  Porokeratosis sub-fourth right, sub-first and fourth left. Pinch callus left. Heloma durum fifth bilateral,    No infection or ulcers.    Diagnosis:  Onychomycosis, , Pain in right toe, pain in left toes,  Porokeratosis  Callus/corn  Treatment & Plan Procedures and Treatment: Consent by patient was obtained for treatment procedures.   Debridement of mycotic and hypertrophic toenails, 1 through 5 bilateral and clearing of subungual debris. No ulceration, no infection noted. Debride callus/corn.  I Return Visit-Office Procedure: Patient instructed to return to the office for a follow up visit 10 weeks  for continued evaluation and treatment.    Gardiner Barefoot DPM

## 2019-07-05 ENCOUNTER — Encounter: Payer: Self-pay | Admitting: Podiatry

## 2019-07-05 ENCOUNTER — Ambulatory Visit (INDEPENDENT_AMBULATORY_CARE_PROVIDER_SITE_OTHER): Payer: Medicare Other | Admitting: Podiatry

## 2019-07-05 ENCOUNTER — Other Ambulatory Visit: Payer: Self-pay

## 2019-07-05 VITALS — Temp 97.3°F

## 2019-07-05 DIAGNOSIS — E119 Type 2 diabetes mellitus without complications: Secondary | ICD-10-CM | POA: Diagnosis not present

## 2019-07-05 DIAGNOSIS — D689 Coagulation defect, unspecified: Secondary | ICD-10-CM | POA: Diagnosis not present

## 2019-07-05 DIAGNOSIS — B351 Tinea unguium: Secondary | ICD-10-CM | POA: Diagnosis not present

## 2019-07-05 DIAGNOSIS — L84 Corns and callosities: Secondary | ICD-10-CM

## 2019-07-05 DIAGNOSIS — M79675 Pain in left toe(s): Secondary | ICD-10-CM

## 2019-07-05 DIAGNOSIS — M79674 Pain in right toe(s): Secondary | ICD-10-CM

## 2019-07-05 NOTE — Progress Notes (Signed)
Complaint:  Visit Type: Patient returns to my office for continued preventative foot care services. Complaint: Patient states" my nails have grown long and thick and become painful to walk and wear shoes" Patient has been diagnosed with DM with no foot complications. The patient presents for preventative foot care services. No changes to ROS.  Patient has pain noted fifth toes  Both feet.  Painful callus on bottom of both feet. Patient is taking coumadin.  Podiatric Exam: Vascular: dorsalis pedis and posterior tibial pulses are palpable bilateral. Capillary return is immediate. Temperature gradient is WNL. Skin turgor WNL  Sensorium: Normal Semmes Weinstein monofilament test. Normal tactile sensation bilaterally. Nail Exam: Pt has thick disfigured discolored nails with subungual debris noted bilateral entire nail hallux through fifth toenails Ulcer Exam: There is no evidence of ulcer or pre-ulcerative changes or infection. Orthopedic Exam: Muscle tone and strength are WNL. No limitations in general ROM. No crepitus or effusions noted. Foot type and digits show no abnormalities. Bony prominences are unremarkable. Skin:  Porokeratosis sub-fourth right, sub-first and fourth left. Pinch callus left. Heloma durum fifth bilateral,    No infection or ulcers.    Diagnosis:  Onychomycosis, , Pain in right toe, pain in left toes,  Porokeratosis  Callus/corn  Treatment & Plan Procedures and Treatment: Consent by patient was obtained for treatment procedures.   Debridement of mycotic and hypertrophic toenails, 1 through 5 bilateral and clearing of subungual debris. No ulceration, no infection noted. Debride callus/corn.  I Return Visit-Office Procedure: Patient instructed to return to the office for a follow up visit 10 weeks  for continued evaluation and treatment.    Lorilee Cafarella DPM 

## 2019-08-30 ENCOUNTER — Ambulatory Visit (INDEPENDENT_AMBULATORY_CARE_PROVIDER_SITE_OTHER): Payer: Medicare Other | Admitting: Podiatry

## 2019-08-30 ENCOUNTER — Other Ambulatory Visit: Payer: Self-pay

## 2019-08-30 ENCOUNTER — Encounter: Payer: Self-pay | Admitting: Podiatry

## 2019-08-30 DIAGNOSIS — Q828 Other specified congenital malformations of skin: Secondary | ICD-10-CM

## 2019-08-30 DIAGNOSIS — L84 Corns and callosities: Secondary | ICD-10-CM

## 2019-08-30 NOTE — Progress Notes (Signed)
Complaint:  Visit Type: Patient returns to my office for continued preventative foot care services. Complaint: Patient states"  That the callus under her right forefoot is extremely painful.  She also has painful corns both feet.   Patient has been diagnosed with DM with no foot complications. The patient presents for preventative foot care services. No changes to ROS.   Patient is taking coumadin.  Podiatric Exam: Vascular: dorsalis pedis and posterior tibial pulses are palpable bilateral. Capillary return is immediate. Temperature gradient is WNL. Skin turgor WNL  Sensorium: Normal Semmes Weinstein monofilament test. Normal tactile sensation bilaterally. Nail Exam: Pt has thick disfigured discolored nails with subungual debris noted bilateral entire nail hallux through fifth toenails Ulcer Exam: There is no evidence of ulcer or pre-ulcerative changes or infection. Orthopedic Exam: Muscle tone and strength are WNL. No limitations in general ROM. No crepitus or effusions noted. Foot type and digits show no abnormalities. Bony prominences are unremarkable. Skin:  Porokeratosis sub-fourth right, sub-first and fourth left. Pinch callus left. Heloma durum fifth bilateral,    No infection or ulcers.    Diagnosis:   Porokeratosis  Callus/corn  Treatment & Plan Procedures and Treatment: Consent by patient was obtained for treatment procedures.   . Debride callus/corn.  both feet.I Return Visit-Office Procedure: Patient instructed to return to the office for a follow up visit prn for preventative foot care services.Gardiner Barefoot DPM

## 2019-11-11 ENCOUNTER — Other Ambulatory Visit: Payer: Self-pay

## 2019-11-11 ENCOUNTER — Encounter: Payer: Self-pay | Admitting: Podiatry

## 2019-11-11 ENCOUNTER — Ambulatory Visit: Payer: Medicare Other | Admitting: Podiatry

## 2019-11-11 DIAGNOSIS — B351 Tinea unguium: Secondary | ICD-10-CM

## 2019-11-11 DIAGNOSIS — Q828 Other specified congenital malformations of skin: Secondary | ICD-10-CM

## 2019-11-11 DIAGNOSIS — M79675 Pain in left toe(s): Secondary | ICD-10-CM | POA: Diagnosis not present

## 2019-11-11 DIAGNOSIS — D689 Coagulation defect, unspecified: Secondary | ICD-10-CM

## 2019-11-11 DIAGNOSIS — E119 Type 2 diabetes mellitus without complications: Secondary | ICD-10-CM | POA: Diagnosis not present

## 2019-11-11 DIAGNOSIS — M79674 Pain in right toe(s): Secondary | ICD-10-CM

## 2019-11-11 NOTE — Progress Notes (Signed)
Complaint:  Visit Type: Patient returns to my office for continued preventative foot care services. Complaint: Patient states"  That the callus under her right forefoot is extremely painful.  She also has painful corns both feet.   Patient has been diagnosed with DM with no foot complications. The patient presents for preventative foot care services. No changes to ROS.   Patient is taking coumadin.  Podiatric Exam: Vascular: dorsalis pedis and posterior tibial pulses are palpable bilateral. Capillary return is immediate. Temperature gradient is WNL. Skin turgor WNL  Sensorium: Normal Semmes Weinstein monofilament test. Normal tactile sensation bilaterally. Nail Exam: Pt has thick disfigured discolored nails with subungual debris noted bilateral entire nail hallux through fifth toenails Ulcer Exam: There is no evidence of ulcer or pre-ulcerative changes or infection. Orthopedic Exam: Muscle tone and strength are WNL. No limitations in general ROM. No crepitus or effusions noted. Foot type and digits show no abnormalities. Bony prominences are unremarkable. Skin:  Porokeratosis sub-fourth right, sub-first and fourth left. Pinch callus left. Heloma durum fifth bilateral,    No infection or ulcers.    Diagnosis:   Porokeratosis  Callus/corn  Treatment & Plan Procedures and Treatment: Consent by patient was obtained for treatment procedures.   . Debride callus/corn.  both feet.I Return Visit-Office Procedure: Patient instructed to return to the office for a follow up visit prn for preventative foot care services.Gardiner Barefoot DPM

## 2020-01-20 ENCOUNTER — Other Ambulatory Visit: Payer: Self-pay

## 2020-01-20 ENCOUNTER — Ambulatory Visit: Payer: Medicare Other | Admitting: Podiatry

## 2020-01-20 ENCOUNTER — Encounter: Payer: Self-pay | Admitting: Podiatry

## 2020-01-20 VITALS — Temp 98.0°F

## 2020-01-20 DIAGNOSIS — Q828 Other specified congenital malformations of skin: Secondary | ICD-10-CM

## 2020-01-20 DIAGNOSIS — E119 Type 2 diabetes mellitus without complications: Secondary | ICD-10-CM

## 2020-01-20 DIAGNOSIS — D689 Coagulation defect, unspecified: Secondary | ICD-10-CM | POA: Diagnosis not present

## 2020-01-20 DIAGNOSIS — L84 Corns and callosities: Secondary | ICD-10-CM | POA: Diagnosis not present

## 2020-01-20 DIAGNOSIS — B351 Tinea unguium: Secondary | ICD-10-CM

## 2020-01-20 DIAGNOSIS — M79675 Pain in left toe(s): Secondary | ICD-10-CM

## 2020-01-20 DIAGNOSIS — M79674 Pain in right toe(s): Secondary | ICD-10-CM

## 2020-01-20 NOTE — Progress Notes (Signed)
This patient returns to my office for at risk foot care.  This patient requires this care by a professional since this patient will be at risk due to having diabetes.chronic kidney disease,  Patient is taking coumadin. This patient is unable to cut nails themselves since the patient cannot reach their nails.These nails are painful walking and wearing shoes.  This patient presents for at risk foot care today.  General Appearance  Alert, conversant and in no acute stress.  Vascular  Dorsalis pedis and posterior tibial  pulses are palpable  bilaterally.  Capillary return is within normal limits  bilaterally. Temperature is within normal limits  bilaterally.  Neurologic  Senn-Weinstein monofilament wire test within normal limits  bilaterally. Muscle power within normal limits bilaterally.  Nails Thick disfigured discolored nails with subungual debris  from hallux to fifth toes bilaterally. No evidence of bacterial infection or drainage bilaterally.  Orthopedic  No limitations of motion  feet .  No crepitus or effusions noted.  No bony pathology or digital deformities noted.  Skin  normotropic skin noted.  Callus left hallux. Sub 4th right and heloma durum fifth  B/L    No signs of infections or ulcers noted.     Onychomycosis  Pain in right toes  Pain in left toes  Callus  B/L  Consent was obtained for treatment procedures.   Mechanical debridement of nails 1-5  bilaterally performed with a nail nipper.  Filed with dremel without incident. No infection or ulcer.  Debridement of callus with # 15 Blade.     Return office visit  10 weeks         Told patient to return for periodic foot care and evaluation due to potential at risk complications.   Gardiner Barefoot DPM

## 2020-05-01 ENCOUNTER — Encounter: Payer: Self-pay | Admitting: Podiatry

## 2020-05-01 ENCOUNTER — Ambulatory Visit: Payer: Medicare Other | Admitting: Podiatry

## 2020-05-01 ENCOUNTER — Other Ambulatory Visit: Payer: Self-pay

## 2020-05-01 DIAGNOSIS — D689 Coagulation defect, unspecified: Secondary | ICD-10-CM | POA: Diagnosis not present

## 2020-05-01 DIAGNOSIS — E119 Type 2 diabetes mellitus without complications: Secondary | ICD-10-CM

## 2020-05-01 DIAGNOSIS — L84 Corns and callosities: Secondary | ICD-10-CM

## 2020-05-01 DIAGNOSIS — Q828 Other specified congenital malformations of skin: Secondary | ICD-10-CM | POA: Diagnosis not present

## 2020-05-01 DIAGNOSIS — M79675 Pain in left toe(s): Secondary | ICD-10-CM | POA: Diagnosis not present

## 2020-05-01 DIAGNOSIS — B351 Tinea unguium: Secondary | ICD-10-CM | POA: Diagnosis not present

## 2020-05-01 DIAGNOSIS — M79674 Pain in right toe(s): Secondary | ICD-10-CM

## 2020-05-01 NOTE — Progress Notes (Signed)
This patient returns to my office for at risk foot care.  This patient requires this care by a professional since this patient will be at risk due to having diabetes.chronic kidney disease,  Patient is taking coumadin.  This patient is unable to cut nails herself  since the patient cannot reach her nails.These nails are painful walking and wearing shoes.   Patient has multiple calluses which are painful when she walks. This patient presents for at risk foot care today.  General Appearance  Alert, conversant and in no acute stress.  Vascular  Dorsalis pedis and posterior tibial  pulses are palpable  bilaterally.  Capillary return is within normal limits  bilaterally. Temperature is within normal limits  bilaterally.  Neurologic  Senn-Weinstein monofilament wire test within normal limits  bilaterally. Muscle power within normal limits bilaterally.  Nails Thick disfigured discolored nails with subungual debris  from hallux to fifth toes bilaterally. No evidence of bacterial infection or drainage bilaterally.  Orthopedic  No limitations of motion  feet .  No crepitus or effusions noted.  No bony pathology or digital deformities noted.  Skin  normotropic skin noted.  Callus left hallux. Sub 4th right and heloma durum fifth  B/L    No signs of infections or ulcers noted.     Onychomycosis  Pain in right toes  Pain in left toes  Callus  B/L  Consent was obtained for treatment procedures.   Mechanical debridement of nails 1-5  bilaterally performed with a nail nipper.  Filed with dremel without incident. No infection or ulcer.  Debridement of callus with # 15 Blade.     Return office visit  10 weeks         Told patient to return for periodic foot care and evaluation due to potential at risk complications.   Gardiner Barefoot DPM

## 2020-07-10 ENCOUNTER — Other Ambulatory Visit: Payer: Self-pay

## 2020-07-10 ENCOUNTER — Ambulatory Visit: Payer: Medicare Other | Admitting: Podiatry

## 2020-07-10 ENCOUNTER — Encounter: Payer: Self-pay | Admitting: Podiatry

## 2020-07-10 DIAGNOSIS — E119 Type 2 diabetes mellitus without complications: Secondary | ICD-10-CM

## 2020-07-10 DIAGNOSIS — Q828 Other specified congenital malformations of skin: Secondary | ICD-10-CM | POA: Diagnosis not present

## 2020-07-10 DIAGNOSIS — B351 Tinea unguium: Secondary | ICD-10-CM | POA: Diagnosis not present

## 2020-07-10 DIAGNOSIS — L84 Corns and callosities: Secondary | ICD-10-CM

## 2020-07-10 DIAGNOSIS — M79675 Pain in left toe(s): Secondary | ICD-10-CM | POA: Diagnosis not present

## 2020-07-10 DIAGNOSIS — D689 Coagulation defect, unspecified: Secondary | ICD-10-CM

## 2020-07-10 DIAGNOSIS — M79674 Pain in right toe(s): Secondary | ICD-10-CM

## 2020-07-10 NOTE — Progress Notes (Signed)
This patient returns to my office for at risk foot care.  This patient requires this care by a professional since this patient will be at risk due to having diabetes.chronic kidney disease and coagulation defect.  Patient is taking coumadin.,    This patient is unable to cut nails herself  since the patient cannot reach her nails.These nails are painful walking and wearing shoes.   Patient has multiple calluses which are painful when she walks. This patient presents for at risk foot care today.  General Appearance  Alert, conversant and in no acute stress.  Vascular  Dorsalis pedis and posterior tibial  pulses are palpable  bilaterally.  Capillary return is within normal limits  bilaterally. Temperature is within normal limits  bilaterally.  Neurologic  Senn-Weinstein monofilament wire test within normal limits  bilaterally. Muscle power within normal limits bilaterally.  Nails Thick disfigured discolored nails with subungual debris  from hallux to fifth toes bilaterally. No evidence of bacterial infection or drainage bilaterally.  Orthopedic  No limitations of motion  feet .  No crepitus or effusions noted.  No bony pathology or digital deformities noted.  Skin  normotropic skin noted.  Callus left hallux. Sub 4th right and heloma durum fifth  B/L    No signs of infections or ulcers noted.     Onychomycosis  Pain in right toes  Pain in left toes  Callus  B/L  Consent was obtained for treatment procedures.   Mechanical debridement of nails 1-5  bilaterally performed with a nail nipper.  Filed with dremel without incident. No infection or ulcer.  Debridement of callus with # 15 Blade.     Return office visit  10 weeks         Told patient to return for periodic foot care and evaluation due to potential at risk complications.   Gardiner Barefoot DPM

## 2020-09-18 ENCOUNTER — Ambulatory Visit: Payer: Medicare Other | Admitting: Podiatry

## 2020-09-20 DIAGNOSIS — IMO0002 Reserved for concepts with insufficient information to code with codable children: Secondary | ICD-10-CM | POA: Insufficient documentation

## 2020-09-25 ENCOUNTER — Encounter: Payer: Self-pay | Admitting: Podiatry

## 2020-09-25 ENCOUNTER — Other Ambulatory Visit: Payer: Self-pay

## 2020-09-25 ENCOUNTER — Ambulatory Visit: Payer: Medicare Other | Admitting: Podiatry

## 2020-09-25 DIAGNOSIS — Q828 Other specified congenital malformations of skin: Secondary | ICD-10-CM | POA: Diagnosis not present

## 2020-09-25 DIAGNOSIS — D689 Coagulation defect, unspecified: Secondary | ICD-10-CM | POA: Diagnosis not present

## 2020-09-25 DIAGNOSIS — E119 Type 2 diabetes mellitus without complications: Secondary | ICD-10-CM

## 2020-09-25 DIAGNOSIS — B351 Tinea unguium: Secondary | ICD-10-CM

## 2020-09-25 DIAGNOSIS — M79674 Pain in right toe(s): Secondary | ICD-10-CM

## 2020-09-25 DIAGNOSIS — M79675 Pain in left toe(s): Secondary | ICD-10-CM

## 2020-09-25 DIAGNOSIS — L84 Corns and callosities: Secondary | ICD-10-CM

## 2020-09-25 NOTE — Progress Notes (Signed)
This patient returns to my office for at risk foot care.  This patient requires this care by a professional since this patient will be at risk due to having diabetes.chronic kidney disease and coagulation defect.  Patient is taking coumadin.,    This patient is unable to cut nails herself  since the patient cannot reach her nails.These nails are painful walking and wearing shoes.   Patient has multiple calluses which are painful when she walks. This patient presents for at risk foot care today.  General Appearance  Alert, conversant and in no acute stress.  Vascular  Dorsalis pedis and posterior tibial  pulses are weakly  palpable  bilaterally.  Capillary return is within normal limits  bilaterally. Temperature is within normal limits  bilaterally.  Neurologic  Senn-Weinstein monofilament wire test within normal limits  bilaterally. Muscle power within normal limits bilaterally.  Nails Thick disfigured discolored nails with subungual debris  from hallux to fifth toes bilaterally. No evidence of bacterial infection or drainage bilaterally.  Orthopedic  No limitations of motion  feet .  No crepitus or effusions noted.  Plantar flexed metatarsal  B/L  Hammer toes 2-5  B/L.  Skin  normotropic skin noted.  Callus left hallux. Sub 4th right and heloma durum fifth  B/L    No signs of infections or ulcers noted.     Onychomycosis  Pain in right toes  Pain in left toes  Callus  B/L  Consent was obtained for treatment procedures.   Mechanical debridement of nails 1-5  bilaterally performed with a nail nipper.  Filed with dremel without incident. No infection or ulcer.  Debridement of callus with # 15 Blade.     Return office visit  9 weeks         Told patient to return for periodic foot care and evaluation due to potential at risk complications.   Gardiner Barefoot DPM

## 2020-11-27 ENCOUNTER — Ambulatory Visit: Payer: Medicare Other | Admitting: Podiatry

## 2020-11-30 ENCOUNTER — Ambulatory Visit: Payer: Medicare Other | Admitting: Podiatry

## 2020-12-11 ENCOUNTER — Ambulatory Visit: Payer: Medicare Other | Admitting: Podiatry

## 2020-12-14 ENCOUNTER — Encounter: Payer: Self-pay | Admitting: Podiatry

## 2020-12-14 ENCOUNTER — Other Ambulatory Visit: Payer: Self-pay

## 2020-12-14 ENCOUNTER — Ambulatory Visit: Payer: Medicare Other | Admitting: Podiatry

## 2020-12-14 DIAGNOSIS — L84 Corns and callosities: Secondary | ICD-10-CM | POA: Diagnosis not present

## 2020-12-14 DIAGNOSIS — N183 Chronic kidney disease, stage 3 unspecified: Secondary | ICD-10-CM | POA: Diagnosis not present

## 2020-12-14 DIAGNOSIS — M79675 Pain in left toe(s): Secondary | ICD-10-CM

## 2020-12-14 DIAGNOSIS — M79674 Pain in right toe(s): Secondary | ICD-10-CM

## 2020-12-14 DIAGNOSIS — D689 Coagulation defect, unspecified: Secondary | ICD-10-CM | POA: Diagnosis not present

## 2020-12-14 DIAGNOSIS — B351 Tinea unguium: Secondary | ICD-10-CM | POA: Diagnosis not present

## 2020-12-14 DIAGNOSIS — E119 Type 2 diabetes mellitus without complications: Secondary | ICD-10-CM

## 2020-12-14 NOTE — Progress Notes (Signed)
This patient returns to my office for at risk foot care.  This patient requires this care by a professional since this patient will be at risk due to having diabetes.chronic kidney disease and coagulation defect.  Patient is taking coumadin.,    This patient is unable to cut nails herself  since the patient cannot reach her nails.These nails are painful walking and wearing shoes.   Patient has multiple calluses which are painful when she walks. This patient presents for at risk foot care today.  General Appearance  Alert, conversant and in no acute stress.  Vascular  Dorsalis pedis and posterior tibial  pulses are weakly  palpable  bilaterally.  Capillary return is within normal limits  bilaterally. Cold feet. Bilaterally. Absent digital hair  B/L.  Neurologic  Senn-Weinstein monofilament wire test within normal limits  bilaterally. Muscle power within normal limits bilaterally.  Nails Thick disfigured discolored nails with subungual debris  from hallux to fifth toes bilaterally. No evidence of bacterial infection or drainage bilaterally.  Orthopedic  No limitations of motion  feet .  No crepitus or effusions noted.  Plantar flexed metatarsal  B/L  Hammer toes 2-5  B/L.  Skin  normotropic skin noted.  Callus left hallux. Sub 4th right and heloma durum fifth  B/L    No signs of infections or ulcers noted.     Onychomycosis  Pain in right toes  Pain in left toes  Callus  B/L  Consent was obtained for treatment procedures.   Mechanical debridement of nails 1-5  bilaterally performed with a nail nipper.  Filed with dremel without incident. No infection or ulcer.  Debridement of callus with # 15 Blade.     Return office visit  9 weeks         Told patient to return for periodic foot care and evaluation due to potential at risk complications.   Gardiner Barefoot DPM

## 2021-02-15 ENCOUNTER — Other Ambulatory Visit: Payer: Self-pay

## 2021-02-15 ENCOUNTER — Encounter: Payer: Self-pay | Admitting: Podiatry

## 2021-02-15 ENCOUNTER — Ambulatory Visit: Payer: Medicare Other | Admitting: Podiatry

## 2021-02-15 DIAGNOSIS — L84 Corns and callosities: Secondary | ICD-10-CM

## 2021-02-15 DIAGNOSIS — M79675 Pain in left toe(s): Secondary | ICD-10-CM | POA: Diagnosis not present

## 2021-02-15 DIAGNOSIS — D689 Coagulation defect, unspecified: Secondary | ICD-10-CM | POA: Diagnosis not present

## 2021-02-15 DIAGNOSIS — N183 Chronic kidney disease, stage 3 unspecified: Secondary | ICD-10-CM | POA: Diagnosis not present

## 2021-02-15 DIAGNOSIS — B351 Tinea unguium: Secondary | ICD-10-CM

## 2021-02-15 DIAGNOSIS — M79674 Pain in right toe(s): Secondary | ICD-10-CM | POA: Diagnosis not present

## 2021-02-15 DIAGNOSIS — E119 Type 2 diabetes mellitus without complications: Secondary | ICD-10-CM | POA: Diagnosis not present

## 2021-02-15 NOTE — Progress Notes (Signed)
This patient returns to my office for at risk foot care.  This patient requires this care by a professional since this patient will be at risk due to having diabetes.chronic kidney disease and coagulation defect.  Patient is taking coumadin.,    This patient is unable to cut nails herself  since the patient cannot reach her nails.These nails are painful walking and wearing shoes.   Patient has multiple calluses which are painful when she walks. This patient presents for at risk foot care today.  General Appearance  Alert, conversant and in no acute stress.  Vascular  Dorsalis pedis and posterior tibial  pulses are weakly  palpable  bilaterally.  Capillary return is within normal limits  bilaterally. Cold feet. Bilaterally. Absent digital hair  B/L.  Neurologic  Senn-Weinstein monofilament wire test within normal limits  bilaterally. Muscle power within normal limits bilaterally.  Nails Thick disfigured discolored nails with subungual debris  from hallux to fifth toes bilaterally. No evidence of bacterial infection or drainage bilaterally.  Orthopedic  No limitations of motion  feet .  No crepitus or effusions noted.  Plantar flexed metatarsal  B/L  Hammer toes 2-5  B/L.  Skin  normotropic skin noted.  Callus left hallux. Sub 4th right and heloma durum fifth  B/L    No signs of infections or ulcers noted.     Onychomycosis  Pain in right toes  Pain in left toes  Callus  B/L  Consent was obtained for treatment procedures.   Mechanical debridement of nails 1-5  bilaterally performed with a nail nipper.  Filed with dremel without incident. No infection or ulcer.  Debridement of callus with # 15 Blade.     Return office visit  9 weeks         Told patient to return for periodic foot care and evaluation due to potential at risk complications.   Gardiner Barefoot DPM

## 2021-02-17 DIAGNOSIS — K922 Gastrointestinal hemorrhage, unspecified: Secondary | ICD-10-CM | POA: Insufficient documentation

## 2021-02-17 DIAGNOSIS — R791 Abnormal coagulation profile: Secondary | ICD-10-CM | POA: Insufficient documentation

## 2021-02-17 DIAGNOSIS — Z7901 Long term (current) use of anticoagulants: Secondary | ICD-10-CM | POA: Insufficient documentation

## 2021-04-19 ENCOUNTER — Ambulatory Visit: Payer: Medicare Other | Admitting: Podiatry

## 2021-05-03 ENCOUNTER — Other Ambulatory Visit: Payer: Self-pay

## 2021-05-03 ENCOUNTER — Encounter: Payer: Self-pay | Admitting: Podiatry

## 2021-05-03 ENCOUNTER — Ambulatory Visit: Payer: Medicare Other | Admitting: Podiatry

## 2021-05-03 DIAGNOSIS — B351 Tinea unguium: Secondary | ICD-10-CM

## 2021-05-03 DIAGNOSIS — N183 Chronic kidney disease, stage 3 unspecified: Secondary | ICD-10-CM

## 2021-05-03 DIAGNOSIS — M79674 Pain in right toe(s): Secondary | ICD-10-CM

## 2021-05-03 DIAGNOSIS — M79675 Pain in left toe(s): Secondary | ICD-10-CM | POA: Diagnosis not present

## 2021-05-03 DIAGNOSIS — L84 Corns and callosities: Secondary | ICD-10-CM

## 2021-05-03 DIAGNOSIS — E119 Type 2 diabetes mellitus without complications: Secondary | ICD-10-CM

## 2021-05-03 DIAGNOSIS — D689 Coagulation defect, unspecified: Secondary | ICD-10-CM

## 2021-05-03 NOTE — Progress Notes (Signed)
This patient returns to my office for at risk foot care.  This patient requires this care by a professional since this patient will be at risk due to having diabetes.chronic kidney disease and coagulation defect.  Patient is taking coumadin.,    This patient is unable to cut nails herself  since the patient cannot reach her nails.These nails are painful walking and wearing shoes.   Patient has multiple calluses which are painful when she walks. This patient presents for at risk foot care today.  General Appearance  Alert, conversant and in no acute stress.  Vascular  Dorsalis pedis and posterior tibial  pulses are weakly  palpable  bilaterally.  Capillary return is within normal limits  bilaterally. Cold feet. Bilaterally. Absent digital hair  B/L.  Neurologic  Senn-Weinstein monofilament wire test within normal limits  bilaterally. Muscle power within normal limits bilaterally.  Nails Thick disfigured discolored nails with subungual debris  from hallux to fifth toes bilaterally. No evidence of bacterial infection or drainage bilaterally.  Orthopedic  No limitations of motion  feet .  No crepitus or effusions noted.  Plantar flexed metatarsal  B/L  Hammer toes 2-5  B/L.  Skin  normotropic skin noted.  Callus left hallux. Sub 4th right and heloma durum fifth  B/L    No signs of infections or ulcers noted.     Onychomycosis  Pain in right toes  Pain in left toes  Callus  B/L  Consent was obtained for treatment procedures.   Mechanical debridement of nails 1-5  bilaterally performed with a nail nipper.  Filed with dremel without incident. No infection or ulcer.  Debridement of callus with # 15 Blade.     Return office visit  9 weeks         Told patient to return for periodic foot care and evaluation due to potential at risk complications.   Gardiner Barefoot DPM

## 2021-05-22 ENCOUNTER — Other Ambulatory Visit
Admission: RE | Admit: 2021-05-22 | Discharge: 2021-05-22 | Disposition: A | Discharge status: Home / Self Care | Payer: Medicare Other | Attending: Ophthalmology | Admitting: Ophthalmology

## 2021-05-22 DIAGNOSIS — H5711 Ocular pain, right eye: Secondary | ICD-10-CM | POA: Insufficient documentation

## 2021-05-22 LAB — SEDIMENTATION RATE: Sed Rate: 21 mm/hr (ref 0–30)

## 2021-05-22 LAB — C-REACTIVE PROTEIN: CRP: 0.6 mg/dL (ref <1.0)

## 2021-07-12 ENCOUNTER — Other Ambulatory Visit: Payer: Self-pay

## 2021-07-12 ENCOUNTER — Ambulatory Visit: Payer: Medicare Other | Admitting: Podiatry

## 2021-07-12 ENCOUNTER — Encounter: Payer: Self-pay | Admitting: Podiatry

## 2021-07-12 DIAGNOSIS — N183 Chronic kidney disease, stage 3 unspecified: Secondary | ICD-10-CM

## 2021-07-12 DIAGNOSIS — M79674 Pain in right toe(s): Secondary | ICD-10-CM | POA: Diagnosis not present

## 2021-07-12 DIAGNOSIS — D689 Coagulation defect, unspecified: Secondary | ICD-10-CM

## 2021-07-12 DIAGNOSIS — M79675 Pain in left toe(s): Secondary | ICD-10-CM

## 2021-07-12 DIAGNOSIS — L84 Corns and callosities: Secondary | ICD-10-CM

## 2021-07-12 DIAGNOSIS — B351 Tinea unguium: Secondary | ICD-10-CM

## 2021-07-12 NOTE — Progress Notes (Signed)
This patient returns to my office for at risk foot care.  This patient requires this care by a professional since this patient will be at risk due to having diabetes.chronic kidney disease and coagulation defect.  Patient is taking coumadin.,    This patient is unable to cut nails herself  since the patient cannot reach her nails.These nails are painful walking and wearing shoes.   Patient has multiple calluses which are painful when she walks. This patient presents for at risk foot care today.  General Appearance  Alert, conversant and in no acute stress.  Vascular  Dorsalis pedis and posterior tibial  pulses are weakly  palpable  bilaterally.  Capillary return is within normal limits  bilaterally. Cold feet. Bilaterally. Absent digital hair  B/L.  Neurologic  Senn-Weinstein monofilament wire test within normal limits  bilaterally. Muscle power within normal limits bilaterally.  Nails Thick disfigured discolored nails with subungual debris  from hallux to fifth toes bilaterally. No evidence of bacterial infection or drainage bilaterally.  Orthopedic  No limitations of motion  feet .  No crepitus or effusions noted.  Plantar flexed metatarsal  B/L  Hammer toes 2-5  B/L.  Skin  normotropic skin noted.  Callus left hallux. Sub 4th right and heloma durum fifth  B/L    No signs of infections or ulcers noted.     Onychomycosis  Pain in right toes  Pain in left toes  Callus  B/L  Consent was obtained for treatment procedures.   Mechanical debridement of nails 1-5  bilaterally performed with a nail nipper.  Filed with dremel without incident. No infection or ulcer.  Debridement of callus with # 15 Blade.     Return office visit  9 weeks         Told patient to return for periodic foot care and evaluation due to potential at risk complications.   Gardiner Barefoot DPM

## 2021-09-02 ENCOUNTER — Emergency Department
Admission: EM | Admit: 2021-09-02 | Discharge: 2021-09-02 | Disposition: A | Discharge status: Home / Self Care | Payer: Medicare Other | Attending: Emergency Medicine | Admitting: Emergency Medicine

## 2021-09-02 ENCOUNTER — Encounter
Admission: EM | Disposition: A | Discharge status: Home / Self Care | Payer: Self-pay | Source: Home / Self Care | Attending: Emergency Medicine

## 2021-09-02 ENCOUNTER — Other Ambulatory Visit: Payer: Self-pay

## 2021-09-02 ENCOUNTER — Encounter: Payer: Self-pay | Admitting: Emergency Medicine

## 2021-09-02 ENCOUNTER — Emergency Department: Payer: Medicare Other

## 2021-09-02 DIAGNOSIS — I2510 Atherosclerotic heart disease of native coronary artery without angina pectoris: Secondary | ICD-10-CM | POA: Diagnosis not present

## 2021-09-02 DIAGNOSIS — R55 Syncope and collapse: Secondary | ICD-10-CM | POA: Insufficient documentation

## 2021-09-02 DIAGNOSIS — Z20822 Contact with and (suspected) exposure to covid-19: Secondary | ICD-10-CM | POA: Diagnosis not present

## 2021-09-02 DIAGNOSIS — I10 Essential (primary) hypertension: Secondary | ICD-10-CM | POA: Diagnosis not present

## 2021-09-02 DIAGNOSIS — E860 Dehydration: Secondary | ICD-10-CM | POA: Diagnosis not present

## 2021-09-02 DIAGNOSIS — Z794 Long term (current) use of insulin: Secondary | ICD-10-CM | POA: Insufficient documentation

## 2021-09-02 DIAGNOSIS — E119 Type 2 diabetes mellitus without complications: Secondary | ICD-10-CM | POA: Insufficient documentation

## 2021-09-02 LAB — CBC WITH DIFFERENTIAL/PLATELET
Abs Immature Granulocytes: 0.06 10*3/uL (ref 0.00–0.07)
Basophils Absolute: 0.1 10*3/uL (ref 0.0–0.1)
Basophils Relative: 1 %
Eosinophils Absolute: 0 10*3/uL (ref 0.0–0.5)
Eosinophils Relative: 0 %
HCT: 36.8 % (ref 36.0–46.0)
Hemoglobin: 12.2 g/dL (ref 12.0–15.0)
Immature Granulocytes: 1 %
Lymphocytes Relative: 10 %
Lymphs Abs: 1.1 10*3/uL (ref 0.7–4.0)
MCH: 31.8 pg (ref 26.0–34.0)
MCHC: 33.2 g/dL (ref 30.0–36.0)
MCV: 95.8 fL (ref 80.0–100.0)
Monocytes Absolute: 1 10*3/uL (ref 0.1–1.0)
Monocytes Relative: 9 %
Neutro Abs: 8.9 10*3/uL — ABNORMAL HIGH (ref 1.7–7.7)
Neutrophils Relative %: 79 %
Platelets: 190 10*3/uL (ref 150–400)
RBC: 3.84 MIL/uL — ABNORMAL LOW (ref 3.87–5.11)
RDW: 13.9 % (ref 11.5–15.5)
WBC: 11.1 10*3/uL — ABNORMAL HIGH (ref 4.0–10.5)
nRBC: 0 % (ref 0.0–0.2)

## 2021-09-02 LAB — COMPREHENSIVE METABOLIC PANEL
ALT: 13 U/L (ref 0–44)
AST: 18 U/L (ref 15–41)
Albumin: 4.2 g/dL (ref 3.5–5.0)
Alkaline Phosphatase: 64 U/L (ref 38–126)
Anion gap: 16 — ABNORMAL HIGH (ref 5–15)
BUN: 37 mg/dL — ABNORMAL HIGH (ref 8–23)
CO2: 23 mmol/L (ref 22–32)
Calcium: 9.6 mg/dL (ref 8.9–10.3)
Chloride: 102 mmol/L (ref 98–111)
Creatinine, Ser: 1.94 mg/dL — ABNORMAL HIGH (ref 0.44–1.00)
GFR, Estimated: 24 mL/min — ABNORMAL LOW (ref >60)
Glucose, Bld: 163 mg/dL — ABNORMAL HIGH (ref 70–99)
Potassium: 4.3 mmol/L (ref 3.5–5.1)
Sodium: 141 mmol/L (ref 135–145)
Total Bilirubin: 1 mg/dL (ref 0.3–1.2)
Total Protein: 7.9 g/dL (ref 6.5–8.1)

## 2021-09-02 LAB — TROPONIN I (HIGH SENSITIVITY)
Troponin I (High Sensitivity): 14 ng/L (ref <18)
Troponin I (High Sensitivity): 14 ng/L (ref <18)

## 2021-09-02 LAB — RESP PANEL BY RT-PCR (FLU A&B, COVID) ARPGX2
Influenza A by PCR: NEGATIVE
Influenza B by PCR: NEGATIVE
SARS Coronavirus 2 by RT PCR: NEGATIVE

## 2021-09-02 SURGERY — CORONARY/GRAFT ACUTE MI REVASCULARIZATION
Anesthesia: Moderate Sedation

## 2021-09-02 MED ORDER — SODIUM CHLORIDE 0.9 % IV BOLUS
500.0000 mL | Freq: Once | INTRAVENOUS | Status: AC
  Administered 2021-09-02: 500 mL via INTRAVENOUS

## 2021-09-02 NOTE — Significant Event (Signed)
Elizabeth Ford is a 87 year old female with a history of CKD, nonobstructive CAD (cath 2016), persistent atrial fibrillation, recent GI bleed in April 2022 who presents following a presyncopal episode.  Her EKG showed atrial fibrillation with a right bundle branch block, and nondiagnostic ST elevation in the lateral leads.  Code STEMI was called and thus I evaluated the patient.  At the time of my evaluation she appears well and has no chest pain or shortness of breath.  She says she had an episode of dizziness followed by presyncope while at home prompting EMS being called but feels fine now.  Her presentation is not consistent with an ST elevation MI, and thus will not be performing a emergent cardiac catheterization.  I defer further work-up to the emergency department team.  Andrez Grime, MD

## 2021-09-02 NOTE — ED Notes (Signed)
Orthos performed, pt asymptomatic despite VS. Abel to ambulate to bathroom with walker & void sans complications.   Daughter states she will take her home if she dispos & she has a f/u appt with EP MD on Wednesday. MD notified of all.

## 2021-09-02 NOTE — Discharge Instructions (Signed)
Your EKG, chest x-ray, and lab test today were all okay.  Your evaluation is reassuring, and your symptoms appear to be due to dehydration.  Be sure to eat regularly and drink plenty of fluids over the next few days.

## 2021-09-02 NOTE — ED Triage Notes (Signed)
Pt reports that she went to church this am and when she got home she felt dizzy and had near syncope. Pt reports that she did fall but did not hit her head. She denies any chest pain. When Medics were on scene they did an EKG that looked like she was having a STEMI, they called code STEMI. Dr. Corky Sox was at bedside when pt came in and cancelled the Code STEMI.

## 2021-09-02 NOTE — ED Provider Notes (Signed)
[facility] Emergency Department Provider Note  ____________________________________________  Time seen: Approximately 5:28 PM  I have reviewed the triage vital signs and the nursing notes.   HISTORY  Chief Complaint Dizziness    HPI Elizabeth Ford is a 87 y.o. female with a past history of CAD, diabetes hypertension atrial fibrillation who comes ED complaining of an episode of dizziness at home.  Patient states she has been in her usual state of health, woke up this morning and had super cereal and orange juice.  Went to church, after returning home at about 2:00 PM she got up and was walking from 1 room to the next in her home when she felt lightheaded causing her to fall down.  She denies any significant injury, does not think that she passed out.  She remembers the event.  Denies any chest pain or shortness of breath.  No abdominal pain back pain vomiting or diaphoresis.  Symptoms were brief, lasting less than 1 minute and then resolving.  She feels fine right now and still has no pain or shortness of breath.  She reports she has not eaten lunch yet today and so the cereal is the only thing she is eaten so far.    Past Medical History:  Diagnosis Date   Atrial fibrillation ([place])    CAD (coronary artery disease)    Compression fx, lumbar spine ([place])    L4   Diabetes mellitus without complication ([place])    Hypertension    Renal insufficiency      Patient Active Problem List   Diagnosis Date Noted   GIB (gastrointestinal bleeding) 02/17/2021   Supratherapeutic INR 02/17/2021   Warfarin anticoagulation 02/17/2021   Problem 09/20/2020   Pain due to onychomycosis of toenails of both feet 04/26/2019   Heloma durum 04/26/2019   Porokeratosis 04/26/2019   Shoulder pain 12/03/2016   Myalgia 07/01/2016   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   Incomplete emptying of bladder 02/14/2016   Dissection of aorta ([place]) 12/20/2015   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   Atrial fibrillation with RVR ([place])    Intractable low back pain 11/17/2015   Urinary incontinence 11/17/2015   Intractable back pain 11/17/2015   Diabetes mellitus without complication [facility])    Atrial fibrillation ([place])    Hypertension    Renal insufficiency    CAD (coronary artery disease)    Arthralgia of right knee 10/10/2015   Trauma 07/30/2015   Syncope 07/28/2015   Vertigo 06/27/2015   Recurrent urinary tract infection 01/24/2014   Corns and callosity 01/13/2014   Advanced care planning/counseling discussion 10/28/2013   Microalbuminuria 10/14/2013   Malaise and fatigue 10/06/2013   Tachycardia 10/06/2013   Mild cognitive impairment 08/26/2013   Chronic kidney disease, stage III (moderate) ([place]) 03/24/2013   DJD (degenerative joint disease) 03/24/2013   Hyperuricemia 03/24/2013   Fatigue 03/02/2013   Obesity 01/04/2008   Hyperlipidemia 09/04/2007     History reviewed. No pertinent surgical history.   Prior to Admission medications   Medication Sig Start Date End Date Taking? Authorizing Provider  ACCU-CHEK AVIVA PLUS test strip  01/06/18   [provider]  ACCU-CHEK SOFTCLIX LANCETS lancets  08/17/18   [provider]  amiodarone (PACERONE) 200 MG tablet Take 100 mg by mouth daily. 04/26/21   [provider]  amLODipine (NORVASC) 2.5 MG tablet Take 2.5 mg by mouth daily. 02/26/21   [provider]  amLODipine (NORVASC) 5 MG  tablet  12/15/17   [provider]  atorvastatin (LIPITOR) 20 MG tablet Take by mouth. 01/06/20   [provider]  Blood Glucose Monitoring Suppl (ACCU-CHEK GUIDE ME) w/Device KIT  01/19/20   [provider]  Blood Glucose Monitoring Suppl ([place]) [place]  01/19/20   [provider]  [place] suspension  02/16/19   [provider]  docusate sodium (COLACE) 100 MG capsule Take by mouth.     [provider]  ELIQUIS 2.5 MG TABS tablet Take 2.5 mg by mouth 2 (two) times daily. 03/25/21   [provider]  erythromycin ophthalmic ointment APPLY A SMALL AMOUNT INTO RIGHT EYE THREE TIMES DAILY 01/11/20   [provider]  estradiol (ESTRACE) 0.1 MG/GM vaginal cream Place vaginally. 01/27/20   [provider]  estradiol (ESTRING) 2 MG vaginal ring Place vaginally.    [provider]  fluticasone (FLONASE) 50 MCG/ACT nasal spray 1 spray by Each Nare route daily. 04/21/18 04/21/19  [provider]  fosfomycin (MONUROL) 3 g PACK TAKE 3G BY MOUTH ONCE FOR 1 DOSE 02/22/20   [provider]  gabapentin (NEURONTIN) 300 MG capsule Take 300 mg by mouth daily. 05/22/21   [provider]  hypromellose (GENTEAL) 0.3 % GEL ophthalmic ointment Administer 1 drop to both eyes Three (3) times a day as needed.    [provider]  ketoconazole (NIZORAL) 2 % cream  12/28/18   [provider]  Lancets Misc. (ACCU-CHEK SOFTCLIX LANCET DEV) KIT  08/17/18   [provider]  UNABLE TO FIND SMARTSIG:Topical 05/11/20   [provider]     Allergies Nsaids, Hydrochlorothiazide, Lisinopril, Lovastatin, Pravastatin, and Shellfish allergy   Family History  Problem Relation Age of Onset   Atrial fibrillation Sister    Hypertension Mother    Hypertension Father    Diabetes Father     Social History Social History   Tobacco Use   Smoking status: Never   Smokeless tobacco: Never  Substance Use Topics   Alcohol use: No   Drug use: Not Currently    Review of Systems  Constitutional:   No fever or chills.  ENT:   No sore throat. No rhinorrhea. Cardiovascular:   No chest pain, positive near syncope. Respiratory:   No dyspnea or cough. Gastrointestinal:   Negative for abdominal pain, vomiting and diarrhea.  Musculoskeletal:   Negative for focal pain or swelling All other systems reviewed and are negative  except as documented above in ROS and HPI.  ____________________________________________   PHYSICAL EXAM:  VITAL SIGNS: ED Triage Vitals  Enc Vitals Group     BP 09/02/21 1537 140/90     Pulse Rate 09/02/21 1537 97     Resp 09/02/21 1537 (!) 22     Temp 09/02/21 1537 98.7 F (37.1 C)     Temp Source 09/02/21 1537 Oral     SpO2 09/02/21 1537 97 %     Weight 09/02/21 1539 164 lb (74.4 kg)     Height 09/02/21 1539 '5\' 4"'  (1.626 m)     Head Circumference --      Peak Flow --      Pain Score 09/02/21 1539 0     Pain Loc --      Pain Edu? --      Excl. in [place]? --     Vital signs reviewed, nursing assessments reviewed.   Constitutional:   Alert and oriented. Non-toxic appearance. Eyes:  Conjunctivae are normal. EOMI. PERRL. ENT      Head:   Normocephalic and atraumatic.      Nose:   Normal.      Mouth/Throat: Dry mucous membranes.      Neck:   No meningismus. Full ROM. Hematological/Lymphatic/Immunilogical:   No cervical lymphadenopathy. Cardiovascular:   RRR. Symmetric bilateral radial and DP pulses.  No murmurs. Cap refill less than 2 seconds. Respiratory:   Normal respiratory effort without tachypnea/retractions. Breath sounds are clear and equal bilaterally. No wheezes/rales/rhonchi. Gastrointestinal:   Soft and nontender. Non distended. There is no CVA tenderness.  No rebound, rigidity, or guarding. Genitourinary:   deferred Musculoskeletal:   Normal range of motion in all extremities. No joint effusions.  No lower extremity tenderness.  No edema. Neurologic:   Normal speech and language.  Motor grossly intact. No acute focal neurologic deficits are appreciated.  Skin:    Skin is warm, dry and intact. No rash noted.  No petechiae, purpura, or bullae.  ____________________________________________    LABS (pertinent positives/negatives) (all labs ordered are listed, but only abnormal results are displayed) Labs Reviewed  RESP PANEL BY RT-PCR (FLU A&B, COVID) ARPGX2 -  Abnormal; Notable for the following components:      Result Value   SARS Coronavirus 2 by RT PCR   (*)    Value: INVALID, UNABLE TO DETERMINE THE PRESENCE OF TARGET DUE TO SPECIMEN INTEGRITY. RECOLLECTION REQUESTED.   Influenza A by PCR   (*)    Value: INVALID, UNABLE TO DETERMINE THE PRESENCE OF TARGET DUE TO SPECIMEN INTEGRITY. RECOLLECTION REQUESTED.   Influenza B by PCR   (*)    Value: INVALID, UNABLE TO DETERMINE THE PRESENCE OF TARGET DUE TO SPECIMEN INTEGRITY. RECOLLECTION REQUESTED.   All other components within normal limits  COMPREHENSIVE METABOLIC PANEL - Abnormal; Notable for the following components:   Glucose, Bld 163 (*)    BUN 37 (*)    Creatinine, Ser 1.94 (*)    GFR, Estimated 24 (*)    Anion gap 16 (*)    All other components within normal limits  CBC WITH DIFFERENTIAL/PLATELET - Abnormal; Notable for the following components:   WBC 11.1 (*)    RBC 3.84 (*)    Neutro Abs 8.9 (*)    All other components within normal limits  RESP PANEL BY RT-PCR (FLU A&B, COVID) ARPGX2  TROPONIN I (HIGH SENSITIVITY)  TROPONIN I (HIGH SENSITIVITY)   ____________________________________________   EKG  Interpreted by me Atrial fibrillation, rate of 100.  Right axis.  Right bundle branch block.  Normal ST segments.  Isolated T wave inversions in lead III and lead V3, nonspecific.  No ischemic changes.  No acute ischemic changes compared to previous EKG on November 17, 2015.  ____________________________________________    RADIOLOGY  DG Chest Portable 1 View  Result Date: 09/02/2021 CLINICAL DATA:  Cough and fatigue EXAM: PORTABLE CHEST 1 VIEW COMPARISON:  Chest x-ray 11/17/2015, CT chest 07/28/2015 FINDINGS: Slightly prominent cardiac silhouette likely due to AP portable technique. The heart and mediastinal contours are unchanged. No focal consolidation. No pulmonary edema. No pleural effusion. No pneumothorax. No acute osseous abnormality. IMPRESSION: No active disease.  Electronically Signed   By: Iven Finn M.D.   On: 09/02/2021 15:56    ____________________________________________   PROCEDURES Procedures  ____________________________________________  DIFFERENTIAL DIAGNOSIS   Dehydration, electrolyte abnormality, non-STEMI, autonomic insufficiency/orthostatics, UTI, viral illness  CLINICAL IMPRESSION / ASSESSMENT AND PLAN / ED COURSE  Medications ordered in the ED:  Medications  sodium chloride 0.9 % bolus 500 mL (0 mLs Intravenous Stopped 09/02/21 1934)    Pertinent labs & imaging results that were available during my care of the patient were reviewed by me and considered in my medical decision making (see chart for details).  Elizabeth Ford was evaluated in Emergency Department on 09/02/2021 for the symptoms described in the history of present illness. She was evaluated in the context of the global COVID-19 pandemic, which necessitated consideration that the patient might be at risk for infection with the SARS-CoV-2 virus that causes COVID-19. Institutional protocols and algorithms that pertain to the evaluation of patients at risk for COVID-19 are in a state of rapid change based on information released by regulatory bodies including the CDC and federal and state organizations. These policies and algorithms were followed during the patient's care in the ED.     Clinical Course as of 09/02/21 2000  Sun Sep 02, 2021  1536 Seen on arrival by myself and cardiology Dr. Corky Sox.  No shortness of breath or chest pain.  History more worrisome for viral illness, dehydration, pneumonia, or pleural effusion.  Due to age and comorbidities, will obtain labs including serial troponins.  Will give IV fluids for hydration.  Cardiology has canceled the code STEMI.  No evidence of any significant trauma, no headache or head injury. [PS]  9278 Chest x-ray viewed and interpreted by me, appears unremarkable except for some evidence of underlying emphysema. [PS]     Clinical Course User Index [PS] Carrie Mew, MD     ----------------------------------------- 8:00 PM on 09/02/2021 ----------------------------------------- Patient continues to be asymptomatic in the ED.  She is ambulatory with steady gait, no orthostatic symptoms.  She already has an appoint with her cardiologist within the next week.  Awaiting delta troponin after which time patient can be discharged unless abnormal.  ____________________________________________   FINAL CLINICAL IMPRESSION(S) / ED DIAGNOSES    Final diagnoses:  Near syncope  Dehydration     ED Discharge Orders     None       Portions of this note were generated with dragon dictation software. Dictation errors may occur despite best attempts at proofreading.    Carrie Mew, MD 09/02/21 2000

## 2021-09-17 ENCOUNTER — Ambulatory Visit: Payer: Medicare Other | Admitting: Podiatry

## 2021-10-24 DIAGNOSIS — B029 Zoster without complications: Secondary | ICD-10-CM | POA: Insufficient documentation

## 2021-11-01 ENCOUNTER — Ambulatory Visit: Payer: Medicare Other | Admitting: Podiatry

## 2021-11-01 ENCOUNTER — Encounter: Payer: Self-pay | Admitting: Podiatry

## 2021-11-01 ENCOUNTER — Other Ambulatory Visit: Payer: Self-pay

## 2021-11-01 DIAGNOSIS — M79675 Pain in left toe(s): Secondary | ICD-10-CM | POA: Diagnosis not present

## 2021-11-01 DIAGNOSIS — B351 Tinea unguium: Secondary | ICD-10-CM

## 2021-11-01 DIAGNOSIS — N183 Chronic kidney disease, stage 3 unspecified: Secondary | ICD-10-CM

## 2021-11-01 DIAGNOSIS — M79674 Pain in right toe(s): Secondary | ICD-10-CM | POA: Diagnosis not present

## 2021-11-01 DIAGNOSIS — L84 Corns and callosities: Secondary | ICD-10-CM

## 2021-11-01 DIAGNOSIS — D689 Coagulation defect, unspecified: Secondary | ICD-10-CM

## 2021-11-01 DIAGNOSIS — E119 Type 2 diabetes mellitus without complications: Secondary | ICD-10-CM | POA: Diagnosis not present

## 2021-11-01 NOTE — Progress Notes (Signed)
This patient returns to my office for at risk foot care.  This patient requires this care by a professional since this patient will be at risk due to having diabetes.chronic kidney disease and coagulation defect.  Patient is taking coumadin.,    This patient is unable to cut nails herself  since the patient cannot reach her nails.These nails are painful walking and wearing shoes.   Patient has multiple calluses which are painful when she walks. This patient presents for at risk foot care today.  General Appearance  Alert, conversant and in no acute stress.  Vascular  Dorsalis pedis and posterior tibial  pulses are weakly  palpable  bilaterally.  Capillary return is within normal limits  bilaterally. Cold feet. Bilaterally. Absent digital hair  B/L.  Neurologic  Senn-Weinstein monofilament wire test within normal limits  bilaterally. Muscle power within normal limits bilaterally.  Nails Thick disfigured discolored nails with subungual debris  from hallux to fifth toes bilaterally. No evidence of bacterial infection or drainage bilaterally.  Orthopedic  No limitations of motion  feet .  No crepitus or effusions noted.  Plantar flexed metatarsal  B/L  Hammer toes 2-5  B/L.  Skin  normotropic skin noted.  Callus left hallux. Sub 4th right and heloma durum fifth  B/L    No signs of infections or ulcers noted.     Onychomycosis  Pain in right toes  Pain in left toes  Callus  B/L  Consent was obtained for treatment procedures.   Mechanical debridement of nails 1-5  bilaterally performed with a nail nipper.  Filed with dremel without incident. No infection or ulcer.  Debridement of callus with # 15 Blade.     Return office visit  9 weeks         Told patient to return for periodic foot care and evaluation due to potential at risk complications.   Gardiner Barefoot DPM

## 2021-12-21 IMAGING — DX DG CHEST 1V PORT
1 series · 1 of 1 positions shown · non-contrast
Comparison: Chest x-ray 11/17/2015, CT chest 07/28/2015

CLINICAL DATA: Cough and fatigue

EXAM:
PORTABLE CHEST 1 VIEW

[chest ap]
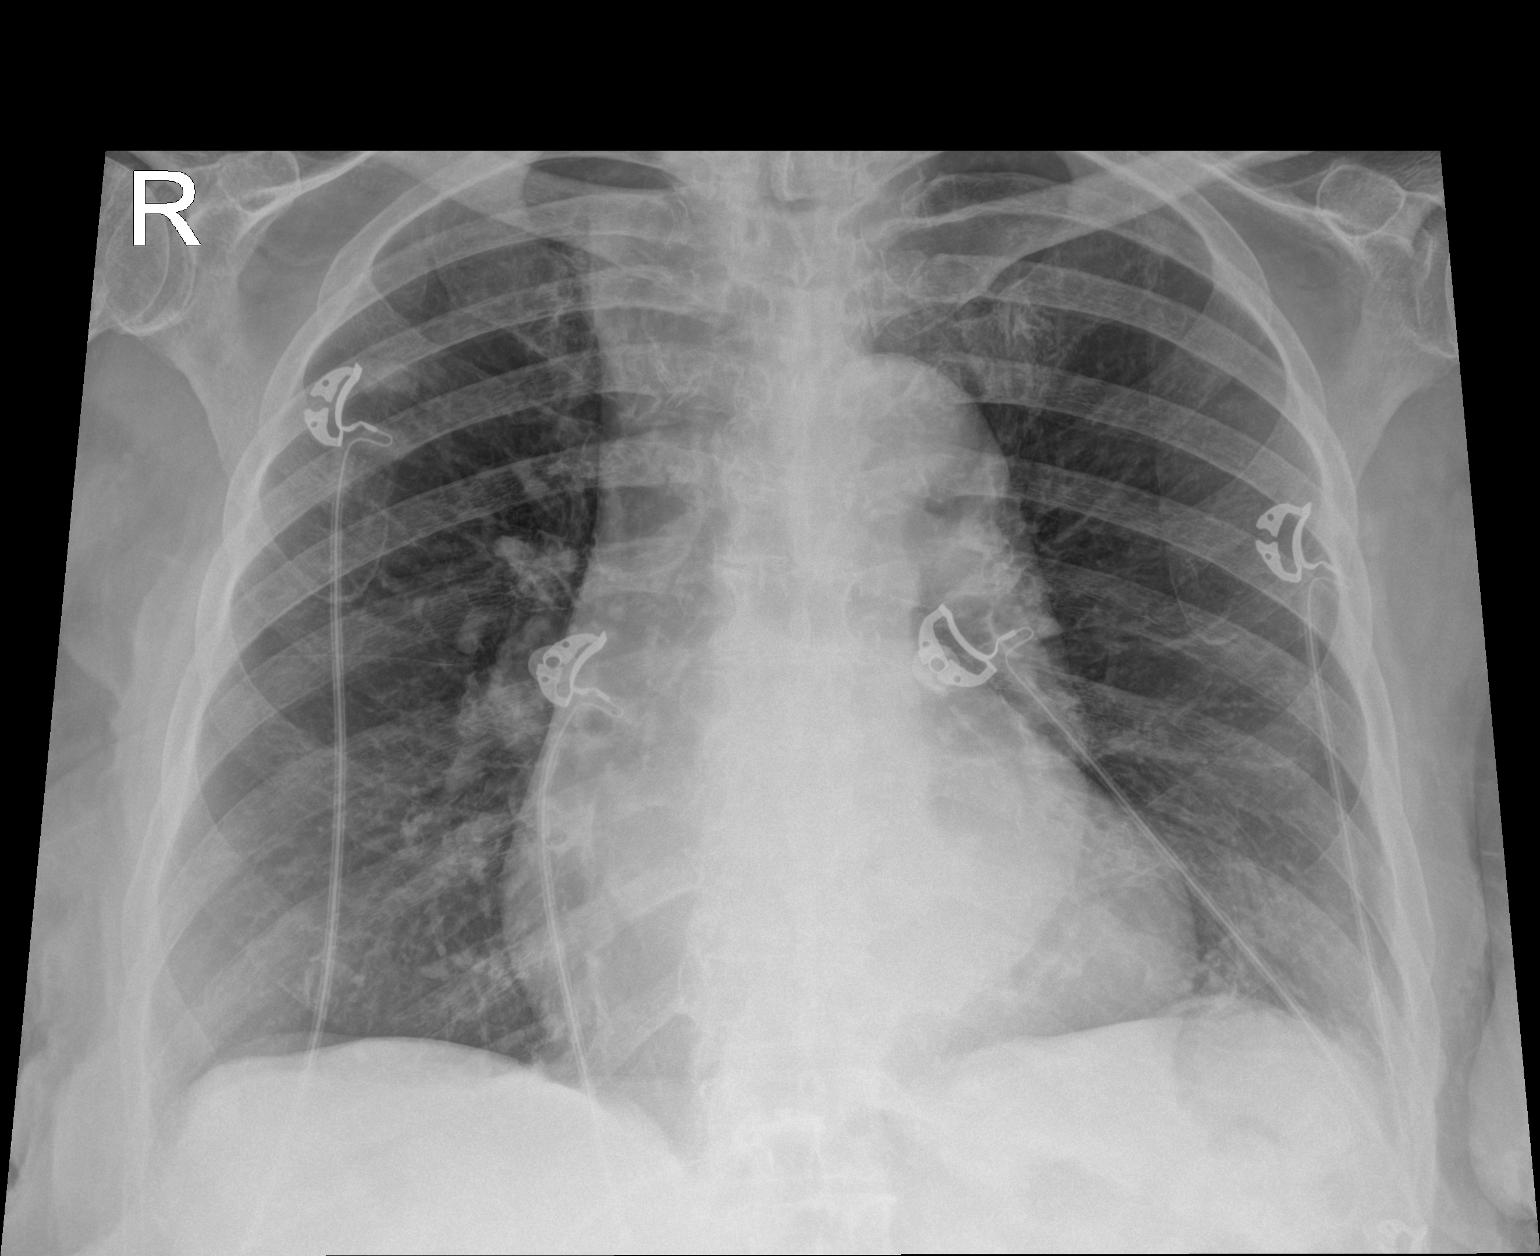

[1 of 1 positions shown; findings below may reference images not displayed]

FINDINGS: Slightly prominent cardiac silhouette likely due to AP portable
technique. The heart and mediastinal contours are unchanged.

No focal consolidation. No pulmonary edema. No pleural effusion. No
pneumothorax.

No acute osseous abnormality.
IMPRESSION: No active disease.

## 2022-01-04 ENCOUNTER — Encounter: Payer: Self-pay | Admitting: Podiatry

## 2022-01-04 ENCOUNTER — Ambulatory Visit: Payer: Medicare Other | Admitting: Podiatry

## 2022-01-04 ENCOUNTER — Other Ambulatory Visit: Payer: Self-pay

## 2022-01-04 DIAGNOSIS — M79675 Pain in left toe(s): Secondary | ICD-10-CM | POA: Diagnosis not present

## 2022-01-04 DIAGNOSIS — B351 Tinea unguium: Secondary | ICD-10-CM | POA: Diagnosis not present

## 2022-01-04 DIAGNOSIS — M79674 Pain in right toe(s): Secondary | ICD-10-CM | POA: Diagnosis not present

## 2022-01-11 NOTE — Progress Notes (Signed)
° °  SUBJECTIVE Patient presents to office today complaining of elongated, thickened nails that cause pain while ambulating in shoes.  Patient is unable to trim their own nails. Patient is here for further evaluation and treatment.  Past Medical History:  Diagnosis Date   Atrial fibrillation ([place])    CAD (coronary artery disease)    Compression fx, lumbar spine ([place])    L4   Diabetes mellitus without complication ([place])    Hypertension    Renal insufficiency     OBJECTIVE General Patient is awake, alert, and oriented x 3 and in no acute distress. Derm Skin is dry and supple bilateral. Negative open lesions or macerations. Remaining integument unremarkable. Nails are tender, long, thickened and dystrophic with subungual debris, consistent with onychomycosis, 1-5 bilateral. No signs of infection noted. Vasc  DP and PT pedal pulses palpable bilaterally. Temperature gradient within normal limits.  Neuro Epicritic and protective threshold sensation grossly intact bilaterally.  Musculoskeletal Exam No symptomatic pedal deformities noted bilateral. Muscular strength within normal limits.  ASSESSMENT 1.  Pain due to onychomycosis of toenails both  PLAN OF CARE 1. Patient evaluated today.  2. Instructed to maintain good pedal hygiene and foot care.  3. Mechanical debridement of nails 1-5 bilaterally performed using a nail nipper. Filed with dremel without incident.  4. Return to clinic in 3 mos.    Edrick Kins, DPM [facility]  Dr. Edrick Kins, DPM    [address], [place] [address]                Office [phone]  Fax [phone]

## 2022-04-04 ENCOUNTER — Encounter: Payer: Self-pay | Admitting: Podiatry

## 2022-04-04 ENCOUNTER — Ambulatory Visit: Payer: Medicare Other | Admitting: Podiatry

## 2022-04-04 DIAGNOSIS — M79675 Pain in left toe(s): Secondary | ICD-10-CM | POA: Diagnosis not present

## 2022-04-04 DIAGNOSIS — D689 Coagulation defect, unspecified: Secondary | ICD-10-CM

## 2022-04-04 DIAGNOSIS — E119 Type 2 diabetes mellitus without complications: Secondary | ICD-10-CM | POA: Diagnosis not present

## 2022-04-04 DIAGNOSIS — N183 Chronic kidney disease, stage 3 unspecified: Secondary | ICD-10-CM

## 2022-04-04 DIAGNOSIS — M79674 Pain in right toe(s): Secondary | ICD-10-CM | POA: Diagnosis not present

## 2022-04-04 DIAGNOSIS — L84 Corns and callosities: Secondary | ICD-10-CM

## 2022-04-04 DIAGNOSIS — B351 Tinea unguium: Secondary | ICD-10-CM | POA: Diagnosis not present

## 2022-04-04 NOTE — Progress Notes (Signed)
This patient returns to my office for at risk foot care.  This patient requires this care by a professional since this patient will be at risk due to having diabetes.chronic kidney disease and coagulation defect.  Patient is taking coumadin.,    This patient is unable to cut nails herself  since the patient cannot reach her nails.These nails are painful walking and wearing shoes.   Patient has multiple calluses which are painful when she walks. This patient presents for at risk foot care today.  General Appearance  Alert, conversant and in no acute stress.  Vascular  Dorsalis pedis and posterior tibial  pulses are weakly  palpable  bilaterally.  Capillary return is within normal limits  bilaterally. Cold feet. Bilaterally. Absent digital hair  B/L.  Neurologic  Senn-Weinstein monofilament wire test within normal limits  bilaterally. Muscle power within normal limits bilaterally.  Nails Thick disfigured discolored nails with subungual debris  from hallux to fifth toes bilaterally. No evidence of bacterial infection or drainage bilaterally.  Orthopedic  No limitations of motion  feet .  No crepitus or effusions noted.  Plantar flexed metatarsal  B/L  Hammer toes 2-5  B/L.  Skin  normotropic skin noted.  Callus left hallux. Sub 4th right and heloma durum fifth  B/L    No signs of infections or ulcers noted.     Onychomycosis  Pain in right toes  Pain in left toes  Callus  B/L  Consent was obtained for treatment procedures.   Mechanical debridement of nails 1-5  bilaterally performed with a nail nipper.  Filed with dremel without incident. No infection or ulcer.  Debridement of callus with # 15 Blade.     Return office visit  10 weeks         Told patient to return for periodic foot care and evaluation due to potential at risk complications.   Azarah Dacy DPM  

## 2022-06-13 ENCOUNTER — Ambulatory Visit: Payer: Medicare Other | Admitting: Podiatry

## 2022-06-13 ENCOUNTER — Encounter: Payer: Self-pay | Admitting: Podiatry

## 2022-06-13 DIAGNOSIS — M79674 Pain in right toe(s): Secondary | ICD-10-CM

## 2022-06-13 DIAGNOSIS — L84 Corns and callosities: Secondary | ICD-10-CM

## 2022-06-13 DIAGNOSIS — D689 Coagulation defect, unspecified: Secondary | ICD-10-CM | POA: Diagnosis not present

## 2022-06-13 DIAGNOSIS — N183 Chronic kidney disease, stage 3 unspecified: Secondary | ICD-10-CM

## 2022-06-13 DIAGNOSIS — E119 Type 2 diabetes mellitus without complications: Secondary | ICD-10-CM | POA: Diagnosis not present

## 2022-06-13 DIAGNOSIS — Q828 Other specified congenital malformations of skin: Secondary | ICD-10-CM

## 2022-06-13 DIAGNOSIS — M79675 Pain in left toe(s): Secondary | ICD-10-CM | POA: Diagnosis not present

## 2022-06-13 DIAGNOSIS — B351 Tinea unguium: Secondary | ICD-10-CM

## 2022-06-13 NOTE — Progress Notes (Signed)
This patient returns to my office for at risk foot care.  This patient requires this care by a professional since this patient will be at risk due to having diabetes.chronic kidney disease and coagulation defect.  Patient is taking coumadin.,    This patient is unable to cut nails herself  since the patient cannot reach her nails.These nails are painful walking and wearing shoes.   Patient has multiple calluses which are painful when she walks. This patient presents for at risk foot care today.  General Appearance  Alert, conversant and in no acute stress.  Vascular  Dorsalis pedis and posterior tibial  pulses are weakly  palpable  bilaterally.  Capillary return is within normal limits  bilaterally. Cold feet. Bilaterally. Absent digital hair  B/L.  Neurologic  Senn-Weinstein monofilament wire test within normal limits  bilaterally. Muscle power within normal limits bilaterally.  Nails Thick disfigured discolored nails with subungual debris  from hallux to fifth toes bilaterally. No evidence of bacterial infection or drainage bilaterally.  Orthopedic  No limitations of motion  feet .  No crepitus or effusions noted.  Plantar flexed metatarsal  B/L  Hammer toes 2-5  B/L.  Skin  normotropic skin noted.  Callus left hallux. Sub 4th right and heloma durum fifth  B/L    No signs of infections or ulcers noted.     Onychomycosis  Pain in right toes  Pain in left toes  Callus  B/L  Consent was obtained for treatment procedures.   Mechanical debridement of nails 1-5  bilaterally performed with a nail nipper.  Filed with dremel without incident. No infection or ulcer.  Debridement of callus with # 15 Blade.     Return office visit  10 weeks         Told patient to return for periodic foot care and evaluation due to potential at risk complications.   Chauncey Sciulli DPM  

## 2022-09-19 ENCOUNTER — Ambulatory Visit: Payer: Medicare Other | Admitting: Podiatry

## 2022-09-19 ENCOUNTER — Encounter: Payer: Self-pay | Admitting: Podiatry

## 2022-09-19 DIAGNOSIS — M79675 Pain in left toe(s): Secondary | ICD-10-CM

## 2022-09-19 DIAGNOSIS — N183 Chronic kidney disease, stage 3 unspecified: Secondary | ICD-10-CM

## 2022-09-19 DIAGNOSIS — D689 Coagulation defect, unspecified: Secondary | ICD-10-CM

## 2022-09-19 DIAGNOSIS — B351 Tinea unguium: Secondary | ICD-10-CM

## 2022-09-19 DIAGNOSIS — M79674 Pain in right toe(s): Secondary | ICD-10-CM

## 2022-09-19 DIAGNOSIS — E119 Type 2 diabetes mellitus without complications: Secondary | ICD-10-CM | POA: Diagnosis not present

## 2022-09-19 DIAGNOSIS — L84 Corns and callosities: Secondary | ICD-10-CM

## 2022-09-19 NOTE — Progress Notes (Signed)
This patient returns to my office for at risk foot care.  This patient requires this care by a professional since this patient will be at risk due to having diabetes.chronic kidney disease and coagulation defect.  Patient is taking coumadin.,    This patient is unable to cut nails herself  since the patient cannot reach her nails.These nails are painful walking and wearing shoes.   Patient has multiple calluses which are painful when she walks. This patient presents for at risk foot care today.  General Appearance  Alert, conversant and in no acute stress.  Vascular  Dorsalis pedis and posterior tibial  pulses are weakly  palpable  bilaterally.  Capillary return is within normal limits  bilaterally. Cold feet. Bilaterally. Absent digital hair  B/L.  Neurologic  Senn-Weinstein monofilament wire test within normal limits  bilaterally. Muscle power within normal limits bilaterally.  Nails Thick disfigured discolored nails with subungual debris  from hallux to fifth toes bilaterally. No evidence of bacterial infection or drainage bilaterally.  Orthopedic  No limitations of motion  feet .  No crepitus or effusions noted.  Plantar flexed metatarsal  B/L  Hammer toes 2-5  B/L.  Skin  normotropic skin noted.  Callus left hallux. Sub 4th right and heloma durum fifth  B/L    No signs of infections or ulcers noted.     Onychomycosis  Pain in right toes  Pain in left toes  Callus  B/L  Consent was obtained for treatment procedures.   Mechanical debridement of nails 1-5  bilaterally performed with a nail nipper.  Filed with dremel without incident. No infection or ulcer.  Debridement of callus with # 15 Blade.     Return office visit  10 weeks         Told patient to return for periodic foot care and evaluation due to potential at risk complications.   Asriel Westrup DPM  

## 2022-10-14 ENCOUNTER — Encounter: Payer: Self-pay | Admitting: Podiatry

## 2022-12-19 ENCOUNTER — Ambulatory Visit: Payer: Medicare Other | Admitting: Podiatry

## 2023-01-09 ENCOUNTER — Ambulatory Visit: Payer: Medicare Other | Admitting: Podiatry

## 2023-01-09 ENCOUNTER — Encounter: Payer: Self-pay | Admitting: Podiatry

## 2023-01-09 VITALS — BP 134/76 | HR 56

## 2023-01-09 DIAGNOSIS — L84 Corns and callosities: Secondary | ICD-10-CM | POA: Diagnosis not present

## 2023-01-09 DIAGNOSIS — Q828 Other specified congenital malformations of skin: Secondary | ICD-10-CM | POA: Diagnosis not present

## 2023-01-09 DIAGNOSIS — E119 Type 2 diabetes mellitus without complications: Secondary | ICD-10-CM

## 2023-01-09 DIAGNOSIS — M79674 Pain in right toe(s): Secondary | ICD-10-CM | POA: Diagnosis not present

## 2023-01-09 DIAGNOSIS — B351 Tinea unguium: Secondary | ICD-10-CM | POA: Diagnosis not present

## 2023-01-09 DIAGNOSIS — M79675 Pain in left toe(s): Secondary | ICD-10-CM

## 2023-01-09 NOTE — Progress Notes (Signed)
This patient returns to my office for at risk foot care.  This patient requires this care by a professional since this patient will be at risk due to having diabetes.chronic kidney disease and coagulation defect.  Patient is taking coumadin.,    This patient is unable to cut nails herself  since the patient cannot reach her nails.These nails are painful walking and wearing shoes.   Patient has multiple calluses which are painful when she walks. This patient presents for at risk foot care today.  General Appearance  Alert, conversant and in no acute stress.  Vascular  Dorsalis pedis and posterior tibial  pulses are weakly  palpable  bilaterally.  Capillary return is within normal limits  bilaterally. Cold feet. Bilaterally. Absent digital hair  B/L.  Neurologic  Senn-Weinstein monofilament wire test within normal limits  bilaterally. Muscle power within normal limits bilaterally.  Nails Thick disfigured discolored nails with subungual debris  from hallux to fifth toes bilaterally. No evidence of bacterial infection or drainage bilaterally.  Orthopedic  No limitations of motion  feet .  No crepitus or effusions noted.  Plantar flexed metatarsal  B/L  Hammer toes 2-5  B/L.  Skin  normotropic skin noted.  Callus left hallux. Sub 4th right and heloma durum fifth  B/L    No signs of infections or ulcers noted.     Onychomycosis  Pain in right toes  Pain in left toes  Callus  B/L  Consent was obtained for treatment procedures.   Mechanical debridement of nails 1-5  bilaterally performed with a nail nipper.  Filed with dremel without incident. No infection or ulcer.  Debridement of callus with # 15 Blade.     Return office visit  10 weeks         Told patient to return for periodic foot care and evaluation due to potential at risk complications.   Gardiner Barefoot DPM

## 2023-04-03 ENCOUNTER — Ambulatory Visit: Payer: Medicare Other | Admitting: Podiatry

## 2023-04-03 ENCOUNTER — Encounter: Payer: Self-pay | Admitting: Podiatry

## 2023-04-03 DIAGNOSIS — M79674 Pain in right toe(s): Secondary | ICD-10-CM | POA: Diagnosis not present

## 2023-04-03 DIAGNOSIS — M79675 Pain in left toe(s): Secondary | ICD-10-CM

## 2023-04-03 DIAGNOSIS — M2041 Other hammer toe(s) (acquired), right foot: Secondary | ICD-10-CM

## 2023-04-03 DIAGNOSIS — Q828 Other specified congenital malformations of skin: Secondary | ICD-10-CM | POA: Diagnosis not present

## 2023-04-03 DIAGNOSIS — L84 Corns and callosities: Secondary | ICD-10-CM | POA: Diagnosis not present

## 2023-04-03 DIAGNOSIS — M2042 Other hammer toe(s) (acquired), left foot: Secondary | ICD-10-CM

## 2023-04-03 DIAGNOSIS — B351 Tinea unguium: Secondary | ICD-10-CM

## 2023-04-03 DIAGNOSIS — E119 Type 2 diabetes mellitus without complications: Secondary | ICD-10-CM | POA: Diagnosis not present

## 2023-04-03 NOTE — Progress Notes (Signed)
ANNUAL DIABETIC FOOT EXAM  Subjective: Elizabeth Ford presents today annual diabetic foot exam. She is accompanied by her granddaughter on today's visit. She has a rash on her right leg above her ankle area. Chief Complaint  Patient presents with   Nail Problem    DFC,no medicines, diet controlled,B/S:per patient,finger sticks when she thinks about it,A1C:7.1,Referring Provider Inc, [employer] [place],LOV:3-4 months ago.      Patient confirms h/o diabetes.  Patient denies any h/o foot wounds.  Risk factors: diabetes, HTN, CAD, hyperlipidemia.  Inc, [facility] is patient's PCP.  Past Medical History:  Diagnosis Date   Atrial fibrillation (HCC)    CAD (coronary artery disease)    Compression fx, lumbar spine (HCC)    L4   Diabetes mellitus without complication (HCC)    Hypertension    Renal insufficiency    Patient Active Problem List   Diagnosis Date Noted   Herpes zoster without complication 10/24/2021   GIB (gastrointestinal bleeding) 02/17/2021   Supratherapeutic INR 02/17/2021   Warfarin anticoagulation 02/17/2021   Problem 09/20/2020   Pain due to onychomycosis of toenails of both feet 04/26/2019   Heloma durum 04/26/2019   Porokeratosis 04/26/2019   Shoulder pain 12/03/2016   Myalgia 07/01/2016   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   Incomplete emptying of bladder 02/14/2016   Dissection of aorta (HCC) 12/20/2015   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   Atrial fibrillation with RVR (HCC)    Intractable low back pain 11/17/2015   Urinary incontinence 11/17/2015   Intractable back pain 11/17/2015   Diabetes mellitus without complication [facility])    Atrial fibrillation (HCC)    Hypertension    Renal insufficiency    CAD (coronary artery disease)    Arthralgia of right knee 10/10/2015   Trauma 07/30/2015   Syncope 07/28/2015   Vertigo 06/27/2015   Recurrent urinary tract infection  01/24/2014   Corns and callosity 01/13/2014   Advanced care planning/counseling discussion 10/28/2013   Microalbuminuria 10/14/2013   Malaise and fatigue 10/06/2013   Tachycardia 10/06/2013   Mild cognitive impairment 08/26/2013   Chronic kidney disease, stage III (moderate) (HCC) 03/24/2013   DJD (degenerative joint disease) 03/24/2013   Hyperuricemia 03/24/2013   Fatigue 03/02/2013   Obesity 01/04/2008   Hyperlipidemia 09/04/2007   History reviewed. No pertinent surgical history. Current Outpatient Medications on File Prior to Visit  Medication Sig Dispense Refill   ACCU-CHEK AVIVA PLUS test strip      ACCU-CHEK SOFTCLIX LANCETS lancets      amiodarone (PACERONE) 200 MG tablet Take 100 mg by mouth daily.     amLODipine (NORVASC) 2.5 MG tablet Take 2.5 mg by mouth daily.     atorvastatin (LIPITOR) 20 MG tablet Take by mouth.     Blood Glucose Monitoring Suppl (ACCU-CHEK GUIDE ME) w/Device KIT      Blood Glucose Monitoring Suppl (GLUCOCOM BLOOD GLUCOSE MONITOR) DEVI      CIPRODEX OTIC suspension      docusate sodium (COLACE) 100 MG capsule Take by mouth.     ELIQUIS 2.5 MG TABS tablet Take 2.5 mg by mouth 2 (two) times daily.     estradiol (ESTRACE) 0.1 MG/GM vaginal cream Place vaginally.     estradiol (ESTRING) 2 MG vaginal ring Place vaginally.     gabapentin (NEURONTIN) 300 MG capsule Take 300 mg by mouth daily.     hypromellose (GENTEAL) 0.3 % GEL ophthalmic ointment Administer 1 drop to both eyes  Three (3) times a day as needed.     Lancets Misc. (ACCU-CHEK SOFTCLIX LANCET DEV) KIT      UNABLE TO FIND SMARTSIG:Topical     amLODipine (NORVASC) 2.5 MG tablet Take 1 tablet by mouth daily. (Patient not taking: Reported on 04/03/2023)     amLODipine (NORVASC) 5 MG tablet      erythromycin ophthalmic ointment APPLY A SMALL AMOUNT INTO RIGHT EYE THREE TIMES DAILY (Patient not taking: Reported on 04/03/2023)     fosfomycin (MONUROL) 3 g PACK TAKE 3G BY MOUTH ONCE FOR 1 DOSE      ketoconazole (NIZORAL) 2 % cream  (Patient not taking: Reported on 04/03/2023)     No current facility-administered medications on file prior to visit.    Allergies  Allergen Reactions   Nsaids Other (See Comments)    Kidney failure   Hydrochlorothiazide     Other reaction(s): gout   Lisinopril Other (See Comments)   Lovastatin Other (See Comments)   Pravastatin Other (See Comments)   Shellfish Allergy Nausea And Vomiting   Social History   Occupational History   Not on file  Tobacco Use   Smoking status: Never   Smokeless tobacco: Never  Substance and Sexual Activity   Alcohol use: No   Drug use: Not Currently   Sexual activity: Not on file   Family History  Problem Relation Age of Onset   Atrial fibrillation Sister    Hypertension Mother    Hypertension Father    Diabetes Father    Immunization History  Administered Date(s) Administered   Influenza,inj,Quad PF,6+ Mos 07/29/2015, 12/06/2015   Influenza-Unspecified 09/29/2014, 11/20/2020     Review of Systems: Negative except as noted in the HPI.   Objective: There were no vitals filed for this visit.  Elizabeth Ford is a pleasant 89 y.o. female in NAD. AAO X 3.  Vascular Examination: CFT <3 seconds b/l. DP/PT pulses faintly palpable b/l. Skin temperature gradient warm to warm b/l. No pain with calf compression. No ischemia or gangrene. No cyanosis or clubbing noted b/l.    Neurological Examination: Sensation grossly intact b/l with 10 gram monofilament. Vibratory sensation intact b/l.   Dermatological Examination: Pedal skin warm and supple b/l.   No open wounds. No interdigital macerations.  Toenails 1-5 b/l thick, discolored, elongated with subungual debris and pain on dorsal palpation.    Skin eruption with serpiginous distribution with leading edge proximally noted lower 1/3 anterior leg RLE. Hyperkeratotic lesion(s) bilateral 5th toes.  No erythema, no edema, no drainage, no fluctuance. Porokeratotic  lesion(s) submet head 3 left foot, submet head 4 right foot, and submet head 5 left foot. No erythema, no edema, no drainage, no fluctuance.  Musculoskeletal Examination: Muscle strength 5/5 to b/l LE. Hammertoe deformity noted 2-5 b/l. Pes planus deformity noted bilateral LE.  Radiographs: None  Lab Results  Component Value Date   HGBA1C 7.1 (H) 11/17/2015   ADA Risk Categorization: High Risk  Patient has one or more of the following: Loss of protective sensation Absent pedal pulses Severe Foot deformity History of foot ulcer  Assessment: 1. Pain due to onychomycosis of toenails of both feet   2. Porokeratosis   3. Corns   4. Acquired hammertoes of both feet   5. Diabetes mellitus without complication (HCC)   6. Encounter for diabetic foot exam [facility])     Plan: -Patient was evaluated and treated. All patient's and/or POA's questions/concerns answered on today's visit. -Patient's family member present. All questions/concerns  addressed on today's visit. -Continue foot and shoe inspections daily. Monitor blood glucose per PCP/Endocrinologist's recommendations. -Patient to continue soft, supportive shoe gear daily. -Discussed diabetic shoe benefit available based on patient's diagnoses. Patient/POA would like to proceed. Order entered for one pair extra depth shoes and 3 pair total contact insoles. Patient qualifies based on diagnoses. -Mycotic toenails 1-5 bilaterally were debrided in length and girth with sterile nail nippers and dremel without incident. -Corn(s) bilateral 5th toes pared utilizing sharp debridement with sterile blade without complication or incident. Total number debrided=2. -Porokeratotic lesion(s) submet head 3 left foot, submet head 4 right foot, and submet head 5 left foot pared and enucleated with sterile currette without incident. Total number of lesions debrided=3. -Recommended Derm evaluation for rash RLE. -Patient/POA to call should there be  question/concern in the interim. Return in about 10 weeks (around 06/12/2023).  Freddie Breech, DPM

## 2023-04-08 ENCOUNTER — Ambulatory Visit (INDEPENDENT_AMBULATORY_CARE_PROVIDER_SITE_OTHER): Payer: Medicare Other

## 2023-04-08 DIAGNOSIS — M2041 Other hammer toe(s) (acquired), right foot: Secondary | ICD-10-CM

## 2023-04-08 DIAGNOSIS — M2042 Other hammer toe(s) (acquired), left foot: Secondary | ICD-10-CM

## 2023-04-08 DIAGNOSIS — E119 Type 2 diabetes mellitus without complications: Secondary | ICD-10-CM

## 2023-04-08 NOTE — Progress Notes (Signed)
Patient presents to the office today for diabetic shoe and insole measuring.  Patient was measured with brannock device to determine size and width for 1 pair of extra depth shoes and foam casted for 3 pair of insoles.   ABN signed.   Documentation of medical necessity will be sent to patient's treating diabetic doctor to verify and sign.   Patient's diabetic provider: Izell Byron, MD   Shoes and insoles will be ordered at that time and patient will be notified for an appointment for fitting when they arrive.   Brannock measurement: 10.5   Patient shoe selection-   1st   Shoe choice:   [id number]  Shoe size ordered: 11

## 2023-04-22 ENCOUNTER — Encounter: Payer: Self-pay | Admitting: Podiatry

## 2023-05-30 ENCOUNTER — Ambulatory Visit (INDEPENDENT_AMBULATORY_CARE_PROVIDER_SITE_OTHER): Payer: Medicare Other | Admitting: Podiatry

## 2023-05-30 ENCOUNTER — Other Ambulatory Visit: Payer: Medicare Other

## 2023-05-30 DIAGNOSIS — E119 Type 2 diabetes mellitus without complications: Secondary | ICD-10-CM | POA: Diagnosis not present

## 2023-05-30 DIAGNOSIS — M2041 Other hammer toe(s) (acquired), right foot: Secondary | ICD-10-CM

## 2023-05-30 DIAGNOSIS — L84 Corns and callosities: Secondary | ICD-10-CM | POA: Diagnosis not present

## 2023-05-30 DIAGNOSIS — M2042 Other hammer toe(s) (acquired), left foot: Secondary | ICD-10-CM

## 2023-05-30 NOTE — Progress Notes (Unsigned)

## 2023-06-16 ENCOUNTER — Ambulatory Visit: Payer: Medicare Other | Admitting: Podiatry

## 2023-06-16 DIAGNOSIS — B351 Tinea unguium: Secondary | ICD-10-CM

## 2023-06-16 DIAGNOSIS — Q828 Other specified congenital malformations of skin: Secondary | ICD-10-CM

## 2023-06-16 DIAGNOSIS — M79675 Pain in left toe(s): Secondary | ICD-10-CM

## 2023-06-16 DIAGNOSIS — L84 Corns and callosities: Secondary | ICD-10-CM

## 2023-06-16 DIAGNOSIS — B353 Tinea pedis: Secondary | ICD-10-CM | POA: Diagnosis not present

## 2023-06-16 DIAGNOSIS — E119 Type 2 diabetes mellitus without complications: Secondary | ICD-10-CM | POA: Diagnosis not present

## 2023-06-16 DIAGNOSIS — M79674 Pain in right toe(s): Secondary | ICD-10-CM

## 2023-06-16 MED ORDER — KETOCONAZOLE 2 % EX CREA: TOPICAL_CREAM | CUTANEOUS | 1 refills | Status: DC

## 2023-06-16 NOTE — Progress Notes (Signed)
  Subjective:  Patient ID: Elizabeth Ford, female    DOB: 24-Jan-1926,  MRN: [id number]  Elizabeth Ford presents to clinic today for preventative diabetic foot care and corn(s) both feet, callus(es) both feet and painful mycotic nails.  Pain interferes with ambulation. Aggravating factors include wearing enclosed shoe gear. Painful toenails interfere with ambulation. Aggravating factors include wearing enclosed shoe gear. Pain is relieved with periodic professional debridement. Painful corns and calluses are aggravated when weightbearing with and without shoegear. Pain is relieved with periodic professional debridement.   Patient is accompanied by family member on today's visit.  PCP is [facility], [facility]  Allergies  Allergen Reactions   Nsaids Other (See Comments)    Kidney failure   Hydrochlorothiazide     Other reaction(s): gout   Lisinopril Other (See Comments)   Lovastatin Other (See Comments)   Pravastatin Other (See Comments)   Shellfish Allergy Nausea And Vomiting    Review of Systems: Negative except as noted in the HPI.  Objective: No changes noted in today's physical examination. There were no vitals filed for this visit. Elizabeth Ford is a pleasant 89 y.o. female WD, WN in NAD. AAO x 3.  Vascular Examination: CFT <3 seconds b/l. DP/PT pulses faintly palpable b/l. Skin temperature gradient warm to warm b/l. No pain with calf compression. No ischemia or gangrene. No cyanosis or clubbing noted b/l.    Neurological Examination: Sensation grossly intact b/l with 10 gram monofilament. Vibratory sensation intact b/l.   Dermatological Examination: Pedal skin warm and supple b/l.   No open wounds. No interdigital macerations.  Toenails 1-5 b/l thick, discolored, elongated with subungual debris and pain on dorsal palpation.    Skin eruption with serpiginous distribution with leading edge proximally noted lower 1/3 anterior leg RLE.   Hyperkeratotic lesion(s)  bilateral 5th toes. No erythema, no edema, no drainage, no fluctuance.   Porokeratotic lesion(s) submet head 3 left foot, submet head 4 right foot, and submet head 5 left foot. No erythema, no edema, no drainage, no fluctuance.  Musculoskeletal Examination: Muscle strength 5/5 to b/l LE. Hammertoe deformity noted 2-5 b/l. Pes planus deformity noted bilateral LE.  Radiographs: None  Assessment/Plan: 1. Pain due to onychomycosis of toenails of both feet   2. Corns   3. Porokeratosis   4. Tinea pedis of both feet   5. Diabetes mellitus without complication (HCC)     Meds ordered this encounter  Medications   ketoconazole (NIZORAL) 2 % cream    Sig: Apply to both feet and between toes once daily for 6 weeks.    Dispense:  60 g    Refill:  1   -Patient's family member present. All questions/concerns addressed on today's visit. -Patient to continue soft, supportive shoe gear daily. -Toenails 1-5 b/l were debrided in length and girth with sterile nail nippers and dremel without iatrogenic bleeding.  -Corn(s) bilateral 5th toes pared utilizing sterile scalpel blade without complication or incident. Total number debrided=2. -Porokeratotic lesion(s) submet head 3 left foot, submet head 4 right foot, and submet head 5 left foot pared and enucleated with sterile currette without incident. Total number of lesions debrided=3. -For tinea pedis, Rx sent to pharmacy for Ketoconazole Cream 2% to be applied once daily for six weeks. -Patient/POA to call should there be question/concern in the interim.   Return in about 9 weeks (around 08/18/2023).  Elizabeth Ford, DPM

## 2023-06-20 ENCOUNTER — Ambulatory Visit: Payer: Medicare Other | Admitting: Podiatry

## 2023-06-25 ENCOUNTER — Encounter: Payer: Self-pay | Admitting: Podiatry

## 2023-08-12 ENCOUNTER — Other Ambulatory Visit: Payer: Self-pay | Admitting: Podiatry

## 2023-08-12 DIAGNOSIS — B353 Tinea pedis: Secondary | ICD-10-CM

## 2023-08-22 ENCOUNTER — Encounter: Payer: Self-pay | Admitting: Podiatry

## 2023-08-22 ENCOUNTER — Ambulatory Visit: Payer: Medicare Other | Admitting: Podiatry

## 2023-08-22 VITALS — BP 147/77

## 2023-08-22 DIAGNOSIS — M79674 Pain in right toe(s): Secondary | ICD-10-CM | POA: Diagnosis not present

## 2023-08-22 DIAGNOSIS — M79675 Pain in left toe(s): Secondary | ICD-10-CM | POA: Diagnosis not present

## 2023-08-22 DIAGNOSIS — Q828 Other specified congenital malformations of skin: Secondary | ICD-10-CM

## 2023-08-22 DIAGNOSIS — E119 Type 2 diabetes mellitus without complications: Secondary | ICD-10-CM

## 2023-08-22 DIAGNOSIS — L84 Corns and callosities: Secondary | ICD-10-CM

## 2023-08-22 DIAGNOSIS — B351 Tinea unguium: Secondary | ICD-10-CM | POA: Diagnosis not present

## 2023-08-22 NOTE — Progress Notes (Signed)
  Subjective:  Patient ID: Elizabeth Ford, female    DOB: 07-20-1926,  MRN: [id number]  89 y.o. female presents to clinic with  corn(s) b/l feet, callus(es) b/l feet and painful mycotic nails.  Pain interferes with ambulation. Aggravating factors include wearing enclosed shoe gear. Painful toenails interfere with ambulation. Aggravating factors include wearing enclosed shoe gear. Pain is relieved with periodic professional debridement. Painful corns and calluses are aggravated when weightbearing with and without shoegear. Pain is relieved with periodic professional debridement.  Chief Complaint  Patient presents with   RFC     New problem(s): None   PCP is [facility]  Allergies  Allergen Reactions   Nsaids Other (See Comments)    Kidney failure   Hydrochlorothiazide     Other reaction(s): gout   Lisinopril Other (See Comments)   Lovastatin Other (See Comments)   Pravastatin Other (See Comments)   Shellfish Allergy Nausea And Vomiting    Review of Systems: Negative except as noted in the HPI.   Objective:  Elizabeth Ford is a pleasant 89 y.o. female WD, WN in NAD.Marland Kitchen  Vascular Examination: Faintly palpable pedal pulses. CFT <3 seconds b/l. No edema. No pain with calf compression b/l. Skin temperature gradient WNL b/l. No ischemia or gangrene noted b/l LE. No cyanosis or clubbing noted b/l LE.  Neurological Examination: Sensation grossly intact b/l with 10 gram monofilament. Vibratory sensation intact b/l.   Dermatological Examination: Pedal skin with normal turgor, texture and tone b/l. Toenails 1-5 b/l thick, discolored, elongated with subungual debris and pain on dorsal palpation. Hyperkeratotic lesion(s) bilateral 5th toes.  No erythema, no edema, no drainage, no fluctuance. Porokeratotic lesion(s) submet head 3 left foot, submet head 4 right foot, and submet head 5 left foot. No erythema, no edema, no drainage, no fluctuance.  Musculoskeletal  Examination: Muscle strength 5/5 to b/l LE. Hammertoe deformity noted 2-5 b/l. Pes planus deformity noted bilateral LE.  Radiographs: None  Assessment:   1. Pain due to onychomycosis of toenails of both feet   2. Corns   3. Porokeratosis   4. Diabetes mellitus without complication (HCC)     Plan:  -Consent given for treatment as described below: -Examined patient. -Continue foot and shoe inspections daily. Monitor blood glucose per PCP/Endocrinologist's recommendations. -Continue supportive shoe gear daily. -Mycotic toenails 1-5 bilaterally were debrided in length and girth with sterile nail nippers and dremel without incident. -Corn(s) bilateral 5th toes pared utilizing sterile scalpel blade without complication or incident. Total number debrided=2. -Porokeratotic lesion(s) submet head 3 left foot, submet head 4 right foot, and submet head 5 left foot pared and enucleated with sterile currette without incident. Total number of lesions debrided=3. -Patient/POA to call should there be question/concern in the interim.  Return in about 9 weeks (around 10/24/2023).  Freddie Breech, DPM

## 2023-09-20 ENCOUNTER — Other Ambulatory Visit: Payer: Self-pay | Admitting: Podiatry

## 2023-09-20 DIAGNOSIS — B353 Tinea pedis: Secondary | ICD-10-CM

## 2023-09-26 DIAGNOSIS — S72411A Displaced unspecified condyle fracture of lower end of right femur, initial encounter for closed fracture: Secondary | ICD-10-CM | POA: Insufficient documentation

## 2023-09-26 DIAGNOSIS — S83519A Sprain of anterior cruciate ligament of unspecified knee, initial encounter: Secondary | ICD-10-CM | POA: Insufficient documentation

## 2023-09-26 DIAGNOSIS — W19XXXA Unspecified fall, initial encounter: Secondary | ICD-10-CM | POA: Insufficient documentation

## 2023-10-01 DIAGNOSIS — Z9181 History of falling: Secondary | ICD-10-CM | POA: Insufficient documentation

## 2023-10-02 DIAGNOSIS — Z7901 Long term (current) use of anticoagulants: Secondary | ICD-10-CM | POA: Insufficient documentation

## 2023-10-02 DIAGNOSIS — E7800 Pure hypercholesterolemia, unspecified: Secondary | ICD-10-CM | POA: Insufficient documentation

## 2023-10-02 DIAGNOSIS — S83511A Sprain of anterior cruciate ligament of right knee, initial encounter: Secondary | ICD-10-CM | POA: Insufficient documentation

## 2023-10-02 DIAGNOSIS — G500 Trigeminal neuralgia: Secondary | ICD-10-CM | POA: Insufficient documentation

## 2023-10-24 ENCOUNTER — Ambulatory Visit: Payer: Medicare Other | Admitting: Podiatry

## 2023-10-30 ENCOUNTER — Encounter: Payer: Self-pay | Admitting: Podiatry

## 2023-10-30 ENCOUNTER — Ambulatory Visit: Payer: Medicare Other | Admitting: Podiatry

## 2023-10-30 DIAGNOSIS — L84 Corns and callosities: Secondary | ICD-10-CM

## 2023-10-30 DIAGNOSIS — M79675 Pain in left toe(s): Secondary | ICD-10-CM | POA: Diagnosis not present

## 2023-10-30 DIAGNOSIS — E119 Type 2 diabetes mellitus without complications: Secondary | ICD-10-CM

## 2023-10-30 DIAGNOSIS — B351 Tinea unguium: Secondary | ICD-10-CM

## 2023-10-30 DIAGNOSIS — M79674 Pain in right toe(s): Secondary | ICD-10-CM

## 2023-10-30 NOTE — Progress Notes (Signed)
Subjective:  Patient ID: Elizabeth Ford, female    DOB: April 30, 1926,  MRN: [id number]  89 y.o. female presents with preventative diabetic foot care and corn(s) of both feet, callus(es) of both feet and painful mycotic nails.  Pain interferes with ambulation. Aggravating factors include wearing enclosed shoe gear. Painful toenails interfere with ambulation. Aggravating factors include wearing enclosed shoe gear. Pain is relieved with periodic professional debridement. Painful corns and calluses are aggravated when weightbearing with and without shoegear. Pain is relieved with periodic professional debridement.  She is accompanied by her son on today's visit. Patient has had a fall since her last visit. She spent some time in rehab and now wears a leg brace on RLE. She states she needs another prescription for Ketoconazole Cream as she is almost out of her 2nd tube.   PCP: [facility] She has an appointment on today.  New problem(s): None.   Review of Systems: Negative except as noted in the HPI.   Allergies  Allergen Reactions   Nsaids Other (See Comments)    Kidney failure   Hydrochlorothiazide     Other reaction(s): gout   Lisinopril Other (See Comments)   Lovastatin Other (See Comments)   Pravastatin Other (See Comments)   Shellfish Allergy Nausea And Vomiting    Objective:  There were no vitals filed for this visit. Constitutional Patient is a pleasant 89 y.o. female thin build in NAD. AAO x 3.  Vascular Capillary fill time to digits <3 seconds.  DP/PT pulse(s) are faintly palpable b/l lower extremities. Pedal hair absent b/l. Lower extremity skin temperature gradient warm to cool b/l. No pain with calf compression b/l. No cyanosis or clubbing noted. No ischemia nor gangrene noted b/l. Trace edema noted BLE.  Neurologic Protective sensation intact 5/5 intact bilaterally with 10g monofilament b/l. Vibratory sensation intact b/l. No clonus b/l.   Dermatologic Pedal  skin is thin, shiny and atrophic b/l.  No open wounds b/l lower extremities. No interdigital macerations b/l lower extremities. Toenails 1-5 b/l elongated, discolored, dystrophic, thickened, crumbly with subungual debris and tenderness to dorsal palpation. Hyperkeratotic lesion(s) bilateral 5th toes, submet head 3 left foot, submet head 4 right foot, and submet head 5 left foot.  No erythema, no edema, no drainage, no fluctuance. Tinea pedis has resolved.  Orthopedic: Normal muscle strength 5/5 to all lower extremity muscle groups bilaterally. Pes planus deformity noted bilateral LE. Utilizes walker for ambulation assistance.   Last HgA1c:      No data to display           Assessment:   1. Pain due to onychomycosis of toenails of both feet   2. Corns and callosities   3. Diabetes mellitus without complication (HCC)    Plan:  Patient was evaluated and treated and all questions answered. Consent given for treatment as described below: -Patient was evaluated today. All questions/concerns addressed on today's visit. -Tinea pedis has resolved. No need to refill Ketoconazole Cream. Start daily moisturizing of feet with moisturizing lotion. -Toenails 1-5 b/l were debrided in length and girth with sterile nail nippers and dremel without iatrogenic bleeding.  -Corn(s) bilateral 5th toes pared utilizing sterile scalpel blade without complication or incident. Total number debrided=2. -Callus(es) submet head 3 left foot, submet head 4 right foot, and submet head 5 left foot pared utilizing sharp debridement with sterile blade without complication or incident. Total number debrided =3. -Patient/POA to call should there be question/concern in the interim.  Return in about 3  months (around 01/28/2024).  Freddie Breech, DPM      [place] LOCATION: [address] [place] [address]                   Office [phone]   [facility]  LOCATION: [address] [place], [place] [address] Office [phone]

## 2024-01-29 ENCOUNTER — Ambulatory Visit: Payer: Medicare Other | Admitting: Podiatry

## 2024-02-05 ENCOUNTER — Ambulatory Visit: Admitting: Podiatry

## 2024-02-09 ENCOUNTER — Ambulatory Visit: Admitting: Podiatry

## 2024-02-09 ENCOUNTER — Encounter: Payer: Self-pay | Admitting: Podiatry

## 2024-02-09 VITALS — Ht 64.0 in | Wt 164.0 lb

## 2024-02-09 DIAGNOSIS — L84 Corns and callosities: Secondary | ICD-10-CM | POA: Diagnosis not present

## 2024-02-09 DIAGNOSIS — E119 Type 2 diabetes mellitus without complications: Secondary | ICD-10-CM | POA: Diagnosis not present

## 2024-02-09 DIAGNOSIS — M79674 Pain in right toe(s): Secondary | ICD-10-CM | POA: Diagnosis not present

## 2024-02-09 DIAGNOSIS — B351 Tinea unguium: Secondary | ICD-10-CM

## 2024-02-09 DIAGNOSIS — M79675 Pain in left toe(s): Secondary | ICD-10-CM | POA: Diagnosis not present

## 2024-02-09 NOTE — Progress Notes (Signed)
 Subjective:  Patient ID: Elizabeth Ford, female    DOB: 02-14-1926,  MRN: [id number]  Elizabeth Ford presents to clinic today for preventative diabetic foot care and corn(s) b/l feet, callus(es) b/l feet and painful elongated, discolored, dystrophic nails.  Pain interferes with ambulation. Aggravating factors include wearing enclosed shoe gear. Painful toenails interfere with ambulation. Aggravating factors include wearing enclosed shoe gear. Pain is relieved with periodic professional debridement. Painful corns and calluses are aggravated when weightbearing with and without shoegear. Pain is relieved with periodic professional debridement. She is accompanied by her son on today's visit. Chief Complaint  Patient presents with   Nail Problem    Pt is here for [facility] unsure of last A1C PCP is Dr Grayling Congress and LOV was last week.   New problem(s): None.   PCP is [facility]  Allergies  Allergen Reactions   Nsaids Other (See Comments)    Kidney failure   Hydrochlorothiazide     Other reaction(s): gout   Lisinopril Other (See Comments)   Lovastatin Other (See Comments)   Pravastatin Other (See Comments)   Shellfish Allergy Nausea And Vomiting    Review of Systems: Negative except as noted in the HPI.  Objective: No changes noted in today's physical examination. There were no vitals filed for this visit. Elizabeth Ford is a pleasant 89 y.o. female WD, WN in NAD. AAO x 3.  Vascular Examination: CFT <3 seconds b/l. DP/PT pulses faintly palpable b/l. Skin temperature gradient warm to cool b/l. No pain with calf compression. No ischemia or gangrene. No cyanosis or clubbing noted b/l. Dependent edema noted b/l LE.   Neurological Examination: Sensation grossly intact b/l with 10 gram monofilament. Vibratory sensation intact b/l.   Dermatological Examination: Pedal skin warm and supple b/l.   No open wounds. No interdigital macerations.  Toenails 1-5 b/l thick,  discolored, elongated with subungual debris and pain on dorsal palpation.    Hyperkeratotic lesion(s) dorsal PIPJ of bilateral 5th toes, submet head 3 left foot, submet head 4 right foot, and submet head 5 left foot.  No erythema, no edema, no drainage, no fluctuance.  Musculoskeletal Examination: Muscle strength 5/5 to all lower extremity muscle groups bilaterally. Hammertoe deformity noted 2-5 b/l. Pes planus deformity noted bilateral LE. Utilizes rollator for ambulation assistance.  Radiographs: None  Assessment/Plan: 1. Pain due to onychomycosis of toenails of both feet   2. Corns and callosities   3. Diabetes mellitus without complication [facility])    Consent given for treatment. Patient examined. All patient's and/or POA's questions/concerns addressed on today's visit. Mycotic toenails 1-5 debrided in length and girth without incident. Corn(s)/callus(es) bilateral 5th toes, submet head 3 left foot, submet head 4 right foot, and submet head 5 left foot pared with sharp debridement without incident. Continue foot and shoe inspections daily. Monitor blood glucose per PCP/Endocrinologist's recommendations.Continue soft, supportive shoe gear daily. Report any pedal injuries to medical professional. Call office if there are any quesitons/concerns. -Patient/POA to call should there be question/concern in the interim.   Return in about 3 months (around 05/10/2024).  Freddie Breech, DPM      [place] LOCATION: [address], [place] [address]  Office [phone]   [facility] LOCATION: [address] [place], [place] [address] Office [phone]

## 2024-05-10 ENCOUNTER — Ambulatory Visit (INDEPENDENT_AMBULATORY_CARE_PROVIDER_SITE_OTHER): Admitting: Podiatry

## 2024-05-10 ENCOUNTER — Encounter: Payer: Self-pay | Admitting: Podiatry

## 2024-05-10 DIAGNOSIS — M79675 Pain in left toe(s): Secondary | ICD-10-CM

## 2024-05-10 DIAGNOSIS — M79674 Pain in right toe(s): Secondary | ICD-10-CM | POA: Diagnosis not present

## 2024-05-10 DIAGNOSIS — E119 Type 2 diabetes mellitus without complications: Secondary | ICD-10-CM

## 2024-05-10 DIAGNOSIS — L84 Corns and callosities: Secondary | ICD-10-CM

## 2024-05-10 DIAGNOSIS — B351 Tinea unguium: Secondary | ICD-10-CM | POA: Diagnosis not present

## 2024-05-10 NOTE — Progress Notes (Signed)
 Subjective:  Patient ID: Elizabeth Ford, female    DOB: May 29, 1926,  MRN: [id number]  Elizabeth Ford presents to clinic today for for annual diabetic foot examination. Patient is accompanied by her son on today's visit. Patient states she suffered a fall in May and broke her hip. She had surgical management and rehab. She is now home and still receiving therapy at home. Chief Complaint  Patient presents with   [facility]    Rm2 DFC/ Diabetic/ Blood thinner/ Dr. Lajoyce   New problem(s): None.   PCP is Doughton, Norleen HERO, MD.  Allergies  Allergen Reactions   Nsaids Other (See Comments)    Kidney failure   Hydrochlorothiazide     Other reaction(s): gout   Lisinopril Other (See Comments)   Lovastatin Other (See Comments)   Pravastatin Other (See Comments)   Shellfish Allergy Nausea And Vomiting    Review of Systems: Negative except as noted in the HPI.  Objective: No changes noted in today's physical examination. There were no vitals filed for this visit. Elizabeth Ford is a pleasant 89 y.o. female WD, WN in NAD. AAO x 3.  Vascular Examination: CFT <3 seconds b/l. DP/PT pulses faintly palpable b/l. Skin temperature gradient warm to warm b/l. No pain with calf compression. No ischemia or gangrene. No cyanosis or clubbing noted b/l. Pedal hair absent. Trace edema noted BLE.   Neurological Examination: Sensation grossly intact b/l with 10 gram monofilament. Vibratory sensation intact b/l.   Dermatological Examination: Pedal skin warm and supple b/l.   No open wounds. No interdigital macerations.  Toenails 1-5 b/l thick, discolored, elongated with subungual debris and pain on dorsal palpation.    Hyperkeratotic lesion(s) dorsal PIPJ of left fifth digit and right fifth digit, submet head 3 left foot, submet head 4 right foot, and submet head 5 left foot.  No erythema, no edema, no drainage, no fluctuance.  Musculoskeletal Examination: Muscle strength 5/5 to all lower extremity muscle  groups bilaterally. Hammertoe(s) 2-5 b/l. Pes planus deformity noted bilateral LE. Utilizes wheelchair for mobility assistance.  Radiographs: None  Assessment/Plan: 1. Pain due to onychomycosis of toenails of both feet   2. Corns and callosities   3. Diabetes mellitus without complication (HCC)     -Patient's family member present. All questions/concerns addressed on today's visit. -Diabetic foot examination performed today. -Patient to continue soft, supportive shoe gear daily. -Toenails 1-5 b/l were debrided in length and girth with sterile nail nippers and dremel without iatrogenic bleeding.  -Corn(s) left fifth digit and right fifth digit and callus(es) submet head 3 left foot, submet head 4 right foot, and submet head 5 left foot were pared utilizing sterile scalpel blade.Light bleeding right 5th digit addressed with Lumicain Hemostatic Solution. TAO and light dressing applied. Apply Neoporin Cream to right 5th digit once daily for one week. Call office if  there are any concerns. Total number debrided =5. -Patient/POA to call should there be question/concern in the interim.   Return in about 3 months (around 08/10/2024).  Elizabeth Ford, DPM      [place] LOCATION: [address], [place] [address]  Office [phone]   [facility] LOCATION: [address] [place], [place] [address] Office [phone]

## 2024-07-12 DEATH — deceased

## 2024-08-23 ENCOUNTER — Ambulatory Visit: Payer: Self-pay | Admitting: Podiatry
